# Patient Record
Sex: Female | Born: 1950 | Hispanic: No | Smoking: Never smoker
Health system: Southern US, Community
[De-identification: ages and names within clinical notes are randomized; demographics above are authoritative.]

## PROBLEM LIST (undated history)

## (undated) DIAGNOSIS — M1990 Unspecified osteoarthritis, unspecified site: Secondary | ICD-10-CM

## (undated) DIAGNOSIS — T148XXA Other injury of unspecified body region, initial encounter: Secondary | ICD-10-CM

## (undated) DIAGNOSIS — E538 Deficiency of other specified B group vitamins: Secondary | ICD-10-CM

## (undated) DIAGNOSIS — K219 Gastro-esophageal reflux disease without esophagitis: Secondary | ICD-10-CM

## (undated) DIAGNOSIS — I10 Essential (primary) hypertension: Secondary | ICD-10-CM

## (undated) DIAGNOSIS — E039 Hypothyroidism, unspecified: Secondary | ICD-10-CM

## (undated) DIAGNOSIS — M549 Dorsalgia, unspecified: Secondary | ICD-10-CM

## (undated) DIAGNOSIS — L811 Chloasma: Secondary | ICD-10-CM

## (undated) DIAGNOSIS — F329 Major depressive disorder, single episode, unspecified: Secondary | ICD-10-CM

## (undated) DIAGNOSIS — G8929 Other chronic pain: Secondary | ICD-10-CM

## (undated) DIAGNOSIS — E785 Hyperlipidemia, unspecified: Secondary | ICD-10-CM

## (undated) DIAGNOSIS — S43006A Unspecified dislocation of unspecified shoulder joint, initial encounter: Secondary | ICD-10-CM

## (undated) DIAGNOSIS — F32A Depression, unspecified: Secondary | ICD-10-CM

## (undated) DIAGNOSIS — G629 Polyneuropathy, unspecified: Secondary | ICD-10-CM

## (undated) DIAGNOSIS — M797 Fibromyalgia: Secondary | ICD-10-CM

## (undated) DIAGNOSIS — Z8719 Personal history of other diseases of the digestive system: Secondary | ICD-10-CM

## (undated) DIAGNOSIS — G479 Sleep disorder, unspecified: Secondary | ICD-10-CM

## (undated) DIAGNOSIS — Z3400 Encounter for supervision of normal first pregnancy, unspecified trimester: Secondary | ICD-10-CM

## (undated) HISTORY — DX: Deficiency of other specified B group vitamins: E53.8

## (undated) HISTORY — DX: Encounter for supervision of normal first pregnancy, unspecified trimester: Z34.00

## (undated) HISTORY — DX: Depression, unspecified: F32.A

## (undated) HISTORY — DX: Hypothyroidism, unspecified: E03.9

## (undated) HISTORY — DX: Chloasma: L81.1

## (undated) HISTORY — DX: Unspecified dislocation of unspecified shoulder joint, initial encounter: S43.006A

## (undated) HISTORY — DX: Major depressive disorder, single episode, unspecified: F32.9

## (undated) HISTORY — PX: OTHER SURGICAL HISTORY: SHX169

## (undated) HISTORY — DX: Gastro-esophageal reflux disease without esophagitis: K21.9

## (undated) HISTORY — DX: Hyperlipidemia, unspecified: E78.5

## (undated) HISTORY — DX: Unspecified osteoarthritis, unspecified site: M19.90

## (undated) HISTORY — DX: Other injury of unspecified body region, initial encounter: T14.8XXA

## (undated) HISTORY — DX: Sleep disorder, unspecified: G47.9

## (undated) HISTORY — DX: Personal history of other diseases of the digestive system: Z87.19

---

## 2001-05-26 ENCOUNTER — Other Ambulatory Visit: Admission: RE | Admit: 2001-05-26 | Discharge: 2001-05-26 | Payer: Self-pay | Admitting: Family Medicine

## 2002-04-18 ENCOUNTER — Other Ambulatory Visit: Admission: RE | Admit: 2002-04-18 | Discharge: 2002-04-18 | Payer: Self-pay | Admitting: Internal Medicine

## 2003-08-14 ENCOUNTER — Other Ambulatory Visit: Admission: RE | Admit: 2003-08-14 | Discharge: 2003-08-14 | Payer: Self-pay | Admitting: Internal Medicine

## 2003-12-16 LAB — HM COLONOSCOPY: HM Colonoscopy: NORMAL

## 2004-08-20 ENCOUNTER — Encounter: Admission: RE | Admit: 2004-08-20 | Discharge: 2004-08-20 | Payer: Self-pay | Admitting: Internal Medicine

## 2004-09-02 ENCOUNTER — Other Ambulatory Visit: Admission: RE | Admit: 2004-09-02 | Discharge: 2004-09-02 | Payer: Self-pay | Admitting: Internal Medicine

## 2005-07-21 ENCOUNTER — Encounter: Admission: RE | Admit: 2005-07-21 | Discharge: 2005-07-21 | Payer: Self-pay | Admitting: Internal Medicine

## 2005-12-05 ENCOUNTER — Other Ambulatory Visit: Admission: RE | Admit: 2005-12-05 | Discharge: 2005-12-05 | Payer: Self-pay | Admitting: Internal Medicine

## 2007-12-07 ENCOUNTER — Encounter (INDEPENDENT_AMBULATORY_CARE_PROVIDER_SITE_OTHER): Payer: Self-pay | Admitting: *Deleted

## 2007-12-07 ENCOUNTER — Encounter: Payer: Self-pay | Admitting: Internal Medicine

## 2008-01-13 ENCOUNTER — Emergency Department (HOSPITAL_COMMUNITY): Admission: EM | Admit: 2008-01-13 | Discharge: 2008-01-13 | Payer: Self-pay | Admitting: Emergency Medicine

## 2008-01-19 ENCOUNTER — Emergency Department (HOSPITAL_COMMUNITY): Admission: EM | Admit: 2008-01-19 | Discharge: 2008-01-19 | Payer: Self-pay | Admitting: Emergency Medicine

## 2008-12-26 ENCOUNTER — Encounter: Payer: Self-pay | Admitting: Internal Medicine

## 2009-06-12 ENCOUNTER — Encounter: Payer: Self-pay | Admitting: Internal Medicine

## 2009-07-23 ENCOUNTER — Ambulatory Visit: Payer: Self-pay | Admitting: Internal Medicine

## 2009-11-19 ENCOUNTER — Ambulatory Visit: Payer: Self-pay | Admitting: Internal Medicine

## 2009-11-19 DIAGNOSIS — K219 Gastro-esophageal reflux disease without esophagitis: Secondary | ICD-10-CM

## 2009-11-19 DIAGNOSIS — M810 Age-related osteoporosis without current pathological fracture: Secondary | ICD-10-CM | POA: Insufficient documentation

## 2009-11-19 DIAGNOSIS — F329 Major depressive disorder, single episode, unspecified: Secondary | ICD-10-CM | POA: Insufficient documentation

## 2009-11-19 DIAGNOSIS — E785 Hyperlipidemia, unspecified: Secondary | ICD-10-CM

## 2009-11-19 DIAGNOSIS — D518 Other vitamin B12 deficiency anemias: Secondary | ICD-10-CM

## 2009-12-27 ENCOUNTER — Telehealth: Payer: Self-pay | Admitting: *Deleted

## 2010-02-10 ENCOUNTER — Encounter: Admission: RE | Admit: 2010-02-10 | Discharge: 2010-02-10 | Payer: Self-pay | Admitting: Orthopedic Surgery

## 2010-03-05 ENCOUNTER — Telehealth: Payer: Self-pay | Admitting: *Deleted

## 2010-03-15 ENCOUNTER — Encounter: Admission: RE | Admit: 2010-03-15 | Discharge: 2010-03-15 | Payer: Self-pay | Admitting: Orthopedic Surgery

## 2010-03-17 LAB — HM PAP SMEAR

## 2010-03-20 ENCOUNTER — Ambulatory Visit: Payer: Self-pay | Admitting: Internal Medicine

## 2010-03-20 LAB — CONVERTED CEMR LAB
AST: 29 units/L (ref 0–37)
Albumin: 4.1 g/dL (ref 3.5–5.2)
Alkaline Phosphatase: 53 units/L (ref 39–117)
Basophils Relative: 0.8 % (ref 0.0–3.0)
Bilirubin Urine: NEGATIVE
CO2: 28 meq/L (ref 19–32)
Calcium: 8.8 mg/dL (ref 8.4–10.5)
Chloride: 106 meq/L (ref 96–112)
Cholesterol: 204 mg/dL — ABNORMAL HIGH (ref 0–200)
Creatinine, Ser: 0.8 mg/dL (ref 0.4–1.2)
Eosinophils Relative: 1 % (ref 0.0–5.0)
Free T4: 0.7 ng/dL (ref 0.6–1.6)
Glucose, Bld: 88 mg/dL (ref 70–99)
Glucose, Urine, Semiquant: NEGATIVE
HCT: 40.6 % (ref 36.0–46.0)
Hemoglobin: 13.8 g/dL (ref 12.0–15.0)
Lymphs Abs: 1.6 10*3/uL (ref 0.7–4.0)
MCV: 95.1 fL (ref 78.0–100.0)
Monocytes Absolute: 0.4 10*3/uL (ref 0.1–1.0)
Monocytes Relative: 7.6 % (ref 3.0–12.0)
Neutro Abs: 3.4 10*3/uL (ref 1.4–7.7)
Platelets: 303 10*3/uL (ref 150.0–400.0)
RBC: 4.26 M/uL (ref 3.87–5.11)
Total Bilirubin: 0.7 mg/dL (ref 0.3–1.2)
Total CHOL/HDL Ratio: 2
Urobilinogen, UA: 0.2
VLDL: 25.2 mg/dL (ref 0.0–40.0)
Vit D, 25-Hydroxy: 51 ng/mL (ref 30–89)
Vitamin B-12: 431 pg/mL (ref 211–911)
WBC: 5.5 10*3/uL (ref 4.5–10.5)
pH: 5.5

## 2010-03-27 ENCOUNTER — Ambulatory Visit: Payer: Self-pay | Admitting: Internal Medicine

## 2010-03-27 ENCOUNTER — Other Ambulatory Visit: Admission: RE | Admit: 2010-03-27 | Discharge: 2010-03-27 | Payer: Self-pay | Admitting: Internal Medicine

## 2010-03-27 DIAGNOSIS — G479 Sleep disorder, unspecified: Secondary | ICD-10-CM | POA: Insufficient documentation

## 2010-03-29 ENCOUNTER — Ambulatory Visit: Payer: Self-pay | Admitting: Internal Medicine

## 2010-04-01 ENCOUNTER — Telehealth: Payer: Self-pay | Admitting: *Deleted

## 2010-04-08 ENCOUNTER — Encounter: Payer: Self-pay | Admitting: Internal Medicine

## 2010-04-08 ENCOUNTER — Ambulatory Visit: Payer: Self-pay | Admitting: Internal Medicine

## 2010-04-09 ENCOUNTER — Telehealth: Payer: Self-pay | Admitting: *Deleted

## 2010-04-16 ENCOUNTER — Encounter: Payer: Self-pay | Admitting: Internal Medicine

## 2010-04-17 ENCOUNTER — Telehealth: Payer: Self-pay | Admitting: Internal Medicine

## 2010-04-18 ENCOUNTER — Encounter: Payer: Self-pay | Admitting: Internal Medicine

## 2010-04-26 ENCOUNTER — Encounter: Payer: Self-pay | Admitting: Internal Medicine

## 2010-05-08 ENCOUNTER — Ambulatory Visit: Payer: Self-pay | Admitting: Internal Medicine

## 2010-05-08 DIAGNOSIS — M810 Age-related osteoporosis without current pathological fracture: Secondary | ICD-10-CM

## 2010-05-15 ENCOUNTER — Telehealth: Payer: Self-pay | Admitting: Internal Medicine

## 2010-05-21 ENCOUNTER — Emergency Department (HOSPITAL_COMMUNITY): Admission: EM | Admit: 2010-05-21 | Discharge: 2010-05-21 | Payer: Self-pay | Admitting: Emergency Medicine

## 2010-07-03 ENCOUNTER — Telehealth: Payer: Self-pay | Admitting: Internal Medicine

## 2010-07-23 ENCOUNTER — Ambulatory Visit: Payer: Self-pay | Admitting: Family Medicine

## 2010-07-23 DIAGNOSIS — R634 Abnormal weight loss: Secondary | ICD-10-CM | POA: Insufficient documentation

## 2010-07-23 DIAGNOSIS — R030 Elevated blood-pressure reading, without diagnosis of hypertension: Secondary | ICD-10-CM

## 2010-07-23 DIAGNOSIS — R238 Other skin changes: Secondary | ICD-10-CM

## 2010-07-24 ENCOUNTER — Telehealth: Payer: Self-pay | Admitting: Family Medicine

## 2010-07-24 LAB — CONVERTED CEMR LAB
ALT: 21 units/L (ref 0–35)
AST: 32 units/L (ref 0–37)
Albumin: 4.5 g/dL (ref 3.5–5.2)
Alkaline Phosphatase: 60 units/L (ref 39–117)
Basophils Absolute: 0 10*3/uL (ref 0.0–0.1)
Bilirubin, Direct: 0.1 mg/dL (ref 0.0–0.3)
CO2: 24 meq/L (ref 19–32)
Chloride: 101 meq/L (ref 96–112)
Eosinophils Relative: 0.3 % (ref 0.0–5.0)
Glucose, Bld: 79 mg/dL (ref 70–99)
Hemoglobin: 15.1 g/dL — ABNORMAL HIGH (ref 12.0–15.0)
Lymphocytes Relative: 35.1 % (ref 12.0–46.0)
Monocytes Relative: 6.2 % (ref 3.0–12.0)
Platelets: 236 10*3/uL (ref 150.0–400.0)
RDW: 16.3 % — ABNORMAL HIGH (ref 11.5–14.6)
Sodium: 141 meq/L (ref 135–145)
Total Protein: 7.3 g/dL (ref 6.0–8.3)
WBC: 5.3 10*3/uL (ref 4.5–10.5)

## 2010-09-09 ENCOUNTER — Telehealth: Payer: Self-pay | Admitting: *Deleted

## 2010-09-18 ENCOUNTER — Encounter: Payer: Self-pay | Admitting: Internal Medicine

## 2010-09-19 ENCOUNTER — Inpatient Hospital Stay (HOSPITAL_COMMUNITY): Admission: EM | Admit: 2010-09-19 | Discharge: 2010-09-20 | Payer: Self-pay | Admitting: Emergency Medicine

## 2010-09-20 ENCOUNTER — Ambulatory Visit: Payer: Self-pay | Admitting: Psychiatry

## 2010-09-20 ENCOUNTER — Encounter: Payer: Self-pay | Admitting: Internal Medicine

## 2010-09-20 ENCOUNTER — Inpatient Hospital Stay (HOSPITAL_COMMUNITY): Admission: EM | Admit: 2010-09-20 | Discharge: 2010-09-22 | Payer: Self-pay | Admitting: Psychiatry

## 2010-10-09 ENCOUNTER — Ambulatory Visit: Payer: Self-pay | Admitting: Internal Medicine

## 2010-10-09 DIAGNOSIS — L659 Nonscarring hair loss, unspecified: Secondary | ICD-10-CM | POA: Insufficient documentation

## 2010-10-09 LAB — CONVERTED CEMR LAB
Cholesterol: 279 mg/dL — ABNORMAL HIGH (ref 0–200)
HDL: 117.9 mg/dL (ref >39.00)
Total CHOL/HDL Ratio: 2
Triglycerides: 178 mg/dL — ABNORMAL HIGH (ref 0.0–149.0)
VLDL: 35.6 mg/dL (ref 0.0–40.0)

## 2010-10-10 ENCOUNTER — Encounter: Payer: Self-pay | Admitting: Internal Medicine

## 2010-10-11 ENCOUNTER — Telehealth: Payer: Self-pay | Admitting: Internal Medicine

## 2010-10-16 ENCOUNTER — Encounter: Payer: Self-pay | Admitting: Internal Medicine

## 2010-11-02 ENCOUNTER — Ambulatory Visit: Payer: Self-pay | Admitting: Psychiatry

## 2010-12-12 ENCOUNTER — Encounter: Payer: Self-pay | Admitting: *Deleted

## 2011-01-02 ENCOUNTER — Telehealth: Payer: Self-pay | Admitting: Internal Medicine

## 2011-01-03 ENCOUNTER — Ambulatory Visit: Admit: 2011-01-03 | Payer: Self-pay | Admitting: Internal Medicine

## 2011-01-03 ENCOUNTER — Encounter: Payer: Self-pay | Admitting: Internal Medicine

## 2011-01-04 ENCOUNTER — Encounter: Payer: Self-pay | Admitting: Internal Medicine

## 2011-01-05 ENCOUNTER — Encounter: Payer: Self-pay | Admitting: Orthopedic Surgery

## 2011-01-05 ENCOUNTER — Encounter: Payer: Self-pay | Admitting: Sports Medicine

## 2011-01-12 LAB — CONVERTED CEMR LAB: Pap Smear: NEGATIVE

## 2011-01-14 ENCOUNTER — Encounter: Payer: Self-pay | Admitting: Internal Medicine

## 2011-01-14 NOTE — Progress Notes (Signed)
Summary: new rx to [facility]  Phone Note Call from Patient Call back at Home Phone [phone]   Caller: Patient Call For: Madelin Headings MD Summary of Call: pt switch pharmacy please call [facility] [address] [phone] for new rx hydrocodone 10-660 mg Initial call taken by: Heron Sabins,  December 27, 2009 8:48 AM  Follow-up for Phone Call        ok to refill the  vicodin hp  disp 90 no refills  to [facility] See med list. Follow-up by: Madelin Headings MD,  December 27, 2009 10:07 AM  Additional Follow-up for Phone Call Additional follow up Details #1::        Rx called in. Additional Follow-up by: Romualdo Bolk, CMA (AAMA),  December 27, 2009 12:00 PM    Prescriptions: VICODIN HP 10-660 MG TABS (HYDROCODONE-ACETAMINOPHEN) 1 by mouth three times a day for back and knee pain  #90 x 0   Entered by:   Romualdo Bolk, CMA (AAMA)   Authorized by:   Madelin Headings MD   Signed by:   Romualdo Bolk, CMA (AAMA) on 12/27/2009   Method used:   Telephoned to ...       [facility]  [address]  [phone]* (retail)       [address]       [place], [place]  [address]       Ph: [phone] or [phone]       Fax: [phone]   RxID:   [id number]

## 2011-01-14 NOTE — Progress Notes (Signed)
Summary: refill on pain meds  Phone Note Call from Patient Call back at Home Phone [phone]   Caller: Patient Summary of Call: Pt needs a refill on her pain meds. Initial call taken by: Romualdo Bolk, CMA Duncan Dull),  October 11, 2010 8:37 AM  Follow-up for Phone Call        i wasnt aware she was still taking a narcotic pain med.  advise  when she last took these  and why.   Also  get opinion from psych  about advisabiliy of these meds.   also labs show elevated lipids but mostly good cholesterol. thyroid shows poss mild hyperthyroid issues  . rec we get endocrine to see her .    ? is this ok? Follow-up by: Madelin Headings MD,  October 11, 2010 10:25 AM  Additional Follow-up for Phone Call Additional follow up Details #1::        Spoke with pt and the last time she took the pain meds was 04/10/10 for back pain. Pt states that she has 4 tabs left.  Pt is aware of the results and wants to go ahead with referral.  Spoke to Dr. Carie Caddy office and Dr. Evelene Croon states that it is fine to give her this rx. Additional Follow-up by: Romualdo Bolk, CMA Duncan Dull),  October 11, 2010 11:23 AM    Additional Follow-up for Phone Call Additional follow up Details #2::    Per Dr. Fabian Sharp- Okay to refill x 1. see refill request. Follow-up by: Romualdo Bolk, CMA (AAMA),  October 11, 2010 11:45 AM

## 2011-01-14 NOTE — Assessment & Plan Note (Signed)
Summary: consult re thyroid and cholesterol issues/hair loss/cjr   Vital Signs:  Patient profile:   60 year old female Menstrual status:  postmenopausal Height:      60.5 inches Weight:      109 pounds BMI:     21.01 Pulse rate:   66 / minute BP sitting:   100 / 70  (left arm) Cuff size:   regular  Vitals Entered By: Romualdo Bolk, CMA (AAMA) (October 09, 2010 12:23 PM) CC: Pt wants thyroid checked because she is loosing hair. Pt states that ranitidine doesn't work for her.   History of Present Illness: Adriana Figueroa comes in today  because of concern about hair shedding and thinning   most recently   and would like lipids checked. Since last visit she has been [facility]  10/7 2011 with suicidal  thoughts  after her husband of 35 years  left.     She is now under rx with dr Evelene Croon.  and doing ok but very stressed. Anxious also about her hair falling out.  No injury or change in meds except as above.   GI: ranitidine  2 once daily still results in break through  burrning but trying to limit the PPi for bone health.  No fractures  .  A   Preventive Screening-Counseling & Management  Alcohol-Tobacco     Alcohol drinks/day: 0     Smoking Status: never  Caffeine-Diet-Exercise     Caffeine use/day: 0     Does Patient Exercise: yes     Type of exercise: treadmill     Exercise (avg: min/session): 5  Current Medications (verified): 1)  Pravastatin Sodium 40 Mg Tabs (Pravastatin Sodium) .Marland Kitchen.. 1 By Mouth Once Daily 2)  Cyanocobalamin 1000 Mcg/ml Soln (Cyanocobalamin) .... Monthly 3)  Vicodin Hp 10-660 Mg Tabs (Hydrocodone-Acetaminophen) .Marland Kitchen.. 1 By Mouth Three Times A Day For Back and Knee Pain 4)  Reclast 5 Mg/124ml Soln (Zoledronic Acid) 5)  Ranitidine Hcl 150 Mg Tabs (Ranitidine Hcl) .Marland Kitchen.. 1 By Mouth Two Times A Day 6)  Trazodone Hcl 50 Mg Tabs (Trazodone Hcl) .... At Night Per Dr Helane Rima. 7)  Promethazine Hcl 25 Mg Tabs (Promethazine Hcl) .Marland Kitchen.. 1 Poq4-6 Hours  As Needed Nausea 8)  Bd Luer-Lok Syringe 25g X 1" 3 Ml Misc (Syringe/needle (Disp)) .... Use As Directed 9)  Wellbutrin Xl 300 Mg Xr24h-Tab (Bupropion Hcl) .Marland Kitchen.. 1 By Mouth Once Daily 10)  Remeron 15 Mg Tabs (Mirtazapine) .Marland Kitchen.. 1 By Mouth Once Daily  Allergies (verified): No Known Drug Allergies  Past History:  Past medical, surgical, family and social histories (including risk factors) reviewed, and no changes noted (except as noted below).  Past Medical History: Depression  decrease sleep. GERD on nexium since 1989  reported having EGD  report not availabe. 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 Bunionectomy on Left in 2009 Dislocated Rt shoulder 2008 Back surgeon.  Ne   Past History:  Care Management: Dermatology: [place] [facility]ics: [facility] Psych: Evelene Croon NS:  Phoebe Perch  Family History: Reviewed history from 11/19/2009 and no changes required. Father: Deceased from Brain aneurysm Mother: HBP, osteoposis, kidney failure Siblings:  Healthy  Social History: Reviewed history from 03/27/2010 and no changes required. Occupation: Homemaker  BS  husband has MS    divorcing Married Never Smoked Alcohol use-no Drug use-no Regular exercise-yes No pets  [place]  Is a vegetarian  eats dairy and takes vitamins. From [place]   originally.   Review of Systems  The patient denies fever, chest pain, syncope, dyspnea on exertion, prolonged cough, abdominal pain, transient blindness, difficulty walking, abnormal bleeding, enlarged lymph nodes, and angioedema.         gets back pain and reflux signs   Physical Exam  General:  tired but healthy appearing in  nad  not agitated  concerned about her hair Head:  normocephalic and atraumatic.   Eyes:  vision grossly intact.   Neck:  No deformities, masses, or tenderness noted. Lungs:  normal respiratory effort, no intercostal retractions, and no accessory muscle use.   Heart:  normal rate, regular rhythm, and no murmur.   Msk:  no redness over joints.   Pulses:  nl cap refill  Extremities:  no clubbing cyanosis or edema  Neurologic:  alert & oriented X3 and gait normal.  non focal  Skin:  turgor normal, color normal, no ecchymoses, no petechiae, and no purpura.   scalp no scarring some thinning top of head.  Cervical Nodes:  No lymphadenopathy noted Psych:  midly  Oriented X3, good eye contact, and not agitated.   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 Laboratory test [phone]) Venipuncture [phone]) Specimen Handling (53664) TLB-TSH (Thyroid Stimulating Hormone) (84443-TSH) TLB-T4 (Thyrox), Free [phone]) TLB-T3, Free (Triiodothyronine) (84481-T3FREE) TLB-B12 + Folate Pnl (82746_82607-B12/FOL)  Problem # 2:  OSTEOPOROSIS (ICD-733.00) on going no fx now Her updated medication list for this problem includes:    Reclast 5 Mg/132ml Soln (Zoledronic acid)  Problem # 3:  DEPRESSION (ICD-311)  agree going back to counseling Jerral Bonito  in addition to her psych meds  with her Geoffry Paradise break up and ,any culturalfactores that complicate the issues  The following medications were removed from the medication list:    Wellbutrin Xl 300 Mg Xr24h-tab (Bupropion hcl) .Marland Kitchen... 1 by mouth qd Her updated medication list for this problem includes:    Trazodone Hcl 50 Mg Tabs (Trazodone hcl) .Marland Kitchen... At night per dr Helane Rima.    Wellbutrin Xl 300 Mg Xr24h-tab (Bupropion hcl) .Marland Kitchen... 1 by mouth once daily    Remeron 15 Mg Tabs (Mirtazapine) .Marland Kitchen... 1 by mouth once  daily  Problem # 4:  GERD (ICD-530.81) disc  incr ranitidine and dec nex The following medications were removed from the medication list:    Nexium 40 Mg Cpdr (Esomeprazole magnesium) .Marland Kitchen... 1 by mouth once daily Her updated medication list for this problem includes:    Ranitidine Hcl 150 Mg Tabs (Ranitidine hcl) .Marland Kitchen... 2 by mouth two times a day  Problem # 5:  ? b12 deficiency  takes b12 shots but  unsure of documentation of  diagnosis  Problem # 6:  HYPERLIPIDEMIA (ICD-272.4) recheck Her updated medication list for this problem includes:    Pravastatin Sodium 40 Mg Tabs (Pravastatin sodium) .Marland Kitchen... 1 by mouth once daily  Orders: Venipuncture (56387) Specimen Handling (56433) TLB-Lipid Panel (80061-LIPID)  Labs Reviewed: SGOT: 32 (07/23/2010)   SGPT: 21 (07/23/2010)   HDL:88.40 (03/20/2010)  Chol:204 (03/20/2010)  Trig:126.0 (  03/20/2010)  Complete Medication List: 1)  Pravastatin Sodium 40 Mg Tabs (Pravastatin sodium) .Marland Kitchen.. 1 by mouth once daily 2)  Cyanocobalamin 1000 Mcg/ml Soln (Cyanocobalamin) .... Monthly 3)  Vicodin Hp 10-660 Mg Tabs (Hydrocodone-acetaminophen) .Marland Kitchen.. 1 by mouth three times a day for back and knee pain 4)  Reclast 5 Mg/142ml Soln (Zoledronic acid) 5)  Ranitidine Hcl 150 Mg Tabs (Ranitidine hcl) .... 2 by mouth two times a day 6)  Trazodone Hcl 50 Mg Tabs (Trazodone hcl) .... At night per dr Helane Rima. 7)  Promethazine Hcl 25 Mg Tabs (Promethazine hcl) .Marland Kitchen.. 1 poq4-6 hours as needed nausea 8)  Bd Luer-lok Syringe 25g X 1" 3 Ml Misc (Syringe/needle (disp)) .... Use as directed 9)  Wellbutrin Xl 300 Mg Xr24h-tab (Bupropion hcl) .Marland Kitchen.. 1 by mouth once daily 10)  Remeron 15 Mg Tabs (Mirtazapine) .Marland Kitchen.. 1 by mouth once daily  Patient Instructions: 1)  You will be informed of lab results when available.  2)  this may be from the extreme stress you have been under. 3)  try the ranitidine 2 two times a day  for you stomach. to see if that works better.    Contraindications/Deferment of Procedures/Staging:    Test/Procedure: FLU VAX    Reason for deferment: patient declined  Prescriptions: RANITIDINE HCL 150 MG TABS (RANITIDINE HCL) 2 by mouth two times a day  #120 x 4   Entered and Authorized by:   Madelin Headings MD   Signed by:   Madelin Headings MD on 10/09/2010   Method used:   Electronically to        [facility]  [phone]* (retail)       [address]       [place], [place]  [address]       Ph: [phone] or [phone]       Fax: [phone]   RxID:   [id number]    Orders Added: 1)  T- * Misc. Laboratory test [99999] 2)  Venipuncture [36415] 3)  Specimen Handling [99000] 4)  TLB-TSH (Thyroid Stimulating Hormone) [84443-TSH] 5)  TLB-T4 (Thyrox), Free [36644-IH4V] 6)  TLB-T3, Free (Triiodothyronine) [42595-G3OVFI] 7)  TLB-B12 + Folate Pnl [82746_82607-B12/FOL] 8)  TLB-Lipid Panel [80061-LIPID] 9)  Est. Patient Level IV [43329]

## 2011-01-14 NOTE — Letter (Signed)
Summary: Records from [facility] - 2010  Records from [facility] - 2010   Imported By: Maryln Gottron 04/08/2010 11:22:56  _____________________________________________________________________  External Attachment:    Type:   Image     Comment:   External Document

## 2011-01-14 NOTE — Progress Notes (Signed)
Summary: diarrhea x 2 days  Phone Note Call from Patient Call back at [facility] Phone [phone]   Caller: Patient Summary of Call: Pt called saying that she is weak and took an half bottle of pepto. Initial call taken by: Romualdo Bolk, CMA (AAMA),  July 03, 2010 11:22 AM  Follow-up for Phone Call        Spoke to pt and she had a little bit of yogart this am and had diarrhea. No fever or abd. pain. Pt has been having diarrhea x 2 days. Pt has taken pepto every 1/2 hour since on 6pm.  Follow-up by: Romualdo Bolk, CMA Duncan Dull),  July 03, 2010 12:59 PM  Additional Follow-up for Phone Call Additional follow up Details #1::        Pt called back saying that everything has subsided and she is fine now. So she doesn't need a call back. Additional Follow-up by: Romualdo Bolk, CMA (AAMA),  July 03, 2010 2:15 PM

## 2011-01-14 NOTE — Progress Notes (Signed)
Summary: Question about Providers  Phone Note Call from Patient Call back at Home Phone [phone]   Caller: Patient Summary of Call: Dr. Fabian Sharp recommended a few psychiatrist for me to see.  However none of these participate with my insurance. I would like to know if Dr. Fabian Sharp has heard of any of the providers my insurance recommended.  Please call back. Initial call taken by: Trixie Dredge,  April 09, 2010 10:19 AM  Follow-up for Phone Call        LMTOCB and leave the name of the providers that are covered under her ins. Follow-up by: Romualdo Bolk, CMA Duncan Dull),  April 09, 2010 10:49 AM  Additional Follow-up for Phone Call Additional follow up Details #1::        Pt called back saying the names were Jamas Lav, Amy Sharrits, Deneen Harts, Dr. Evelene Croon I told pt that Dr. Fabian Sharp does alot of referrals to Dr. Evelene Croon. Pt is aware of this and will call her to schedule an appt. Additional Follow-up by: Romualdo Bolk, CMA (AAMA),  April 09, 2010 11:50 AM

## 2011-01-14 NOTE — Progress Notes (Signed)
Summary: psych referral  Phone Note Call from Patient Call back at Home Phone [phone]   Caller: Patient Summary of Call: Pt called back. Dr. Raquel James is not taking any new pt's and Dr. Evelene Croon can't see her until the  June 10th and she is on the cancelation list. What do you think of Dr. Katharina Caper, Ezzard Flax? They are the only other one's that are covered by her Ins. Initial call taken by: Romualdo Bolk, CMA Duncan Dull),  April 09, 2010 1:57 PM  Follow-up for Phone Call        no opinion I  dont have experience with them . Make sure you call your insurance co also to see if others are now on you plan .   Follow-up by: Madelin Headings MD,  April 09, 2010 5:09 PM  Additional Follow-up for Phone Call Additional follow up Details #1::        Pt has decided to keep the appt with Dr. Evelene Croon for June 10th. Additional Follow-up by: Romualdo Bolk, CMA (AAMA),  April 10, 2010 1:03 PM

## 2011-01-14 NOTE — Progress Notes (Signed)
Summary: Pts Mammogram and Dexa req Prior Authorization via [employer]  Phone Note Call from Patient Call back at [facility] Phone [phone]   Caller: Patient Summary of Call: Pt called and said that [place] insurance is requiring that she gets authorization from Dr. Fabian Sharp in order to get Mammogram and Dexa Scans.  The code for Dexa is [phone].  Initial call taken by: Lucy Antigua,  April 01, 2010 8:51 AM  Follow-up for Phone Call        Order sent to [facility].  Follow-up by: Romualdo Bolk, CMA Duncan Dull),  April 01, 2010 8:55 AM     Appended Document: Orders Update    Clinical Lists Changes  Orders: Added new Referral order of Misc. Referral (Misc. Ref) - Signed

## 2011-01-14 NOTE — Progress Notes (Signed)
Summary: anal irriation  Phone Note Call from Patient Call back at Home Phone [phone]   Caller: Patient Summary of Call: Pt is having severe irriation in anal. Pt is not having any itching just burning. No blood or swelling. This has been going on x 3 days. Pt has been using the wet wipes after going the bathroom. Pt is going to try this first and also put some hydrocortisone cream as well.  Initial call taken by: Romualdo Bolk, CMA Duncan Dull),  September 09, 2010 1:37 PM  Follow-up for Phone Call        agree Follow-up by: Madelin Headings MD,  September 10, 2010 1:16 PM

## 2011-01-14 NOTE — Letter (Signed)
[facility]: [facility]   Imported By: Maryln Gottron 10/03/2010 12:46:46  _____________________________________________________________________  External Attachment:    Type:   Image     Comment:   External Document

## 2011-01-14 NOTE — Assessment & Plan Note (Signed)
Summary: fup//ccm   Vital Signs:  Patient profile:   60 year old female Menstrual status:  postmenopausal Height:      60.5 inches Weight:      110 pounds Temp:     98.3 degrees F oral Pulse rate:   93 / minute BP sitting:   120 / 90  (right arm)  Vitals Entered By: Kathrynn Speed CMA (May 08, 2010 11:20 AM) CC: Fu on bone denisty & mamo/ Take off Nexium /Rx   History of Present Illness: Adriana Figueroa  comes in today  for follow up of multiple medical problems  Since last visit ahs seen back specialist and ns and non surgical problem .   is beeing controlled with three times a day narcotic pain med Psych : seens at Dr Nolen Mu  and back on meds  wellbutrin and trazadone for sleep with some help.  DEXA: had recent dxa showing osteopenia      no recent fx  GI; has tried  to go off ppi    and some rebound     worried about bones .  Mammo: had  diagnostic and was ok but rec 6 months follow up and is concerned if this is too many mammos to do.     Preventive Screening-Counseling & Management  Alcohol-Tobacco     Alcohol drinks/day: 0     Smoking Status: never  Caffeine-Diet-Exercise     Caffeine use/day: 0     Does Patient Exercise: yes     Type of exercise: treadmill     Exercise (avg: min/session): 5  Current Medications (verified): 1)  Wellbutrin Xl 300 Mg Xr24h-Tab (Bupropion Hcl) .Marland Kitchen.. 1 By Mouth Qd 2)  Nexium 40 Mg Cpdr (Esomeprazole Magnesium) .Marland Kitchen.. 1 By Mouth Once Daily 3)  Pravastatin Sodium 40 Mg Tabs (Pravastatin Sodium) .Marland Kitchen.. 1 By Mouth Once Daily 4)  Cyanocobalamin 1000 Mcg/ml Soln (Cyanocobalamin) .... Monthly 5)  Vicodin Hp 10-660 Mg Tabs (Hydrocodone-Acetaminophen) .Marland Kitchen.. 1 By Mouth Three Times A Day For Back and Knee Pain 6)  Reclast 5 Mg/172ml Soln (Zoledronic Acid) 7)  Ranitidine Hcl 150 Mg Tabs (Ranitidine Hcl) .Marland Kitchen.. 1 By Mouth Two Times A Day  Allergies (verified): No Known Drug Allergies  Past History:  Past Medical History: Depression  decrease  sleep. GERD on nexium since 1989  reported having EGD  report not availabe. 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         no cv pulmonary signs   Physical Exam  General:  alert, well-developed, and well-nourished.   in nad  nl gait today and in napain Head:  normocephalic and atraumatic.   Neck:  No deformities, masses, or tenderness noted. Lungs:  normal respiratory effort and no intercostal retractions.   Heart:  normal rate and regular rhythm.   Pulses:  nl cap refill  Neurologic:  nono focal  Cervical Nodes:  No lymphadenopathy noted Psych:  Oriented X3, normally interactive, and good eye contact.  more relaxed and less nxious than bfore    Impression & Recommendations:  Problem # 1:  BACK PAIN W SCOLIOSIS (ICD-724.5) has been  taking regular  narcotic   pain med for control and is better with this today Her updated medication list for this problem includes:    Vicodin Hp 10-660 Mg Tabs (Hydrocodone-acetaminophen) .Marland Kitchen... 1 by mouth three times a day for back and knee pain  Problem # 2:  POSTMENOPAUSAL OSTEOPOROSIS (ICD-733.01)  last dexa was in osteopenia range but had a dexa in the past in the -3 range   before undergoing rx .    did well wit th last infusion  and ok to contiue yearly  for now.      hx of fractures  also on long term ppi in the past  Her updated medication list for this problem includes:    Reclast 5 Mg/186ml Soln (Zoledronic acid)  Orders: Misc. Referral (Misc. Ref)  Problem # 3:  SLEEP DISORDER/DISTURBANCE (ICD-780.50)  Problem # 4:   DEPRESSION (ICD-311) under psych care and doing better  Her updated medication list for this problem includes:    Wellbutrin Xl 300 Mg Xr24h-tab (Bupropion hcl) .Marland Kitchen... 1 by mouth qd    Trazodone Hcl 50 Mg Tabs (Trazodone hcl) .Marland Kitchen... At night per dr Helane Rima.  Problem # 5:  ANEMIA, VITAMIN B12 DEFICIENCY (ICD-281.1) gives her own shots.  Her updated medication list for this problem includes:    Cyanocobalamin 1000 Mcg/ml Soln (Cyanocobalamin) ..... Monthly  Problem # 6:  GERD (ICD-530.81)  Her updated medication list for this problem includes:    Nexium 40 Mg Cpdr (Esomeprazole magnesium) .Marland Kitchen... 1 by mouth once daily    Ranitidine Hcl 150 Mg Tabs (Ranitidine hcl) .Marland Kitchen... 1 by mouth two times a day  Problem # 7:  HYPERLIPIDEMIA (ICD-272.4)  Her updated medication list for this problem includes:    Pravastatin Sodium 40 Mg Tabs (Pravastatin sodium) .Marland Kitchen... 1 by mouth once daily  Complete Medication List: 1)  Wellbutrin Xl 300 Mg Xr24h-tab (Bupropion hcl) .Marland Kitchen.. 1 by mouth qd 2)  Nexium 40 Mg Cpdr (Esomeprazole magnesium) .Marland Kitchen.. 1 by mouth once daily 3)  Pravastatin Sodium 40 Mg Tabs (Pravastatin sodium) .Marland Kitchen.. 1 by mouth once daily 4)  Cyanocobalamin 1000 Mcg/ml Soln (Cyanocobalamin) .... Monthly 5)  Vicodin Hp 10-660 Mg Tabs (Hydrocodone-acetaminophen) .Marland Kitchen.. 1 by mouth three times a day for back and knee pain 6)  Reclast 5 Mg/138ml Soln (Zoledronic acid) 7)  Ranitidine Hcl 150 Mg Tabs (Ranitidine hcl) .Marland Kitchen.. 1 by mouth two times a day 8)  Trazodone Hcl 50 Mg Tabs (Trazodone hcl) .... At night per dr Helane Rima. 9)  Promethazine Hcl 25 Mg Tabs (Promethazine hcl) .Marland Kitchen.. 1 poq4-6 hours as needed nausea 10)  Bd Luer-lok Syringe 25g X 1" 3 Ml Misc (Syringe/needle (disp)) .... Use as directed  Patient Instructions: 1)  can try  2 ranitidine in am   and 1 at night to see if that helps the stomach burning reflux. 2)  Nausea medicine as needed but can cause sedation. 3)  agree with mammogram follow up. 4)  will  call about arranging reclast.  Prescriptions: BD LUER-LOK SYRINGE 25G X 1" 3 ML MISC (SYRINGE/NEEDLE (DISP)) use as directed  #100 x 11   Entered and Authorized by:   Madelin Headings MD   Signed by:   Madelin Headings MD on 05/08/2010   Method used:   Print then Give to Patient   RxID:   [id number] CYANOCOBALAMIN 1000 MCG/ML SOLN (CYANOCOBALAMIN) monthly  #5ml x 11  Entered and Authorized by:   Madelin Headings MD   Signed by:   Madelin Headings MD on 05/08/2010   Method used:   Print then Give to Patient   RxID:   [id number] PROMETHAZINE HCL 25 MG TABS (PROMETHAZINE HCL) 1 poq4-6 hours as needed nausea  #20 x 1   Entered and Authorized by:   Madelin Headings MD   Signed by:   Madelin Headings MD on 05/08/2010   Method used:   Print then Give to Patient   RxID:   [id number] PRAVASTATIN SODIUM 40 MG TABS (PRAVASTATIN SODIUM) 1 by mouth once daily  #30 x 12   Entered and Authorized by:   Madelin Headings MD   Signed by:   Madelin Headings MD on 05/08/2010   Method used:   Print then Give to Patient   RxID:   [id number] RANITIDINE HCL 150 MG TABS (RANITIDINE HCL) 1 by mouth two times a day  #60 x 12   Entered and Authorized by:   Madelin Headings MD   Signed by:   Madelin Headings MD on 05/08/2010   Method used:   Print then Give to Patient   RxID:   [id number]  prolonged viist greater than 50% of visit spent in counseling  25 mintues

## 2011-01-14 NOTE — Progress Notes (Signed)
Summary: Pt cancelled her additional view appt  Phone Note From Other Clinic Call back at [phone]   Caller: [facility] Summary of Call: Pt called them crying that she just wanted to die. Her husband didn't believe her about her having to have addional views of her left breast and her husband cancelled her Ins.  Pt's appt is 5/5 at 3pm. Pt has also told them that she was going to die at age 60. Initial call taken by: Romualdo Bolk, CMA Duncan Dull),  Apr 17, 2010 3:03 PM  Follow-up for Phone Call        patient should see  psychiatrist  and if worse seek emrgent care at [facility] or ED. Follow-up by: Madelin Headings MD,  Apr 17, 2010 10:26 PM  Additional Follow-up for Phone Call Additional follow up Details #1::        LMTOCB Additional Follow-up by: Romualdo Bolk, CMA Duncan Dull),  Apr 18, 2010 12:27 PM    Additional Follow-up for Phone Call Additional follow up Details #2::    Advised pt and she states she does not need to do this. Follow-up by: Lynann Beaver CMA,  Apr 18, 2010 1:21 PM  Additional Follow-up for Phone Call Additional follow up Details #3:: Details for Additional Follow-up Action Taken: Spoke to pt and she states that this is her culture and that is how they talk. Her husband is trying to move and will be canceling her ins. She did go back for the [place] and they want to come back in Nov. She is fine. Additional Follow-up by: Romualdo Bolk, CMA (AAMA),  Apr 19, 2010 11:03 AM

## 2011-01-14 NOTE — Miscellaneous (Signed)
Summary: BONE DENSITY  Clinical Lists Changes  Orders: Added new Test order of T-Bone Densitometry (77080) - Signed Added new Test order of T-Lumbar Vertebral Assessment (77082) - Signed 

## 2011-01-14 NOTE — Progress Notes (Signed)
Summary: Reclast referral?  Phone Note Call from Patient Call back at Home Phone [phone]   Caller: Patient Summary of Call: Pt was calling about referral for reclast. Can we go ahead and get this set up for her? Initial call taken by: Romualdo Bolk, CMA (AAMA),  May 15, 2010 1:15 PM  Follow-up for Phone Call        Per Dr. Fabian Sharp- okay to do. Follow-up by: Romualdo Bolk, CMA Duncan Dull),  May 15, 2010 1:23 PM  Additional Follow-up for Phone Call Additional follow up Details #1::        Order sent to [facility]. Additional Follow-up by: Romualdo Bolk, CMA (AAMA),  May 15, 2010 1:54 PM

## 2011-01-14 NOTE — Progress Notes (Signed)
Summary: REQ FOR REFILL  Phone Note Call from Patient   Caller: Patient [phone] Reason for Call: Refill Medication Summary of Call: Pt called to ck on status of med: VICODIN .Marland Kitchen... Pt adv that the req has been sent by pharmacy but they have not received any response..? Initial call taken by: Debbra Riding,  March 05, 2010 9:34 AM  Follow-up for Phone Call        See refill request. Follow-up by: Romualdo Bolk, CMA Duncan Dull),  March 05, 2010 10:39 AM

## 2011-01-14 NOTE — Progress Notes (Signed)
Summary: Pt req lab results  Phone Note Call from Patient Call back at Home Phone [phone]   Caller: Patient Summary of Call: Pt called re: lab results. Pls call back asap.  Initial call taken by: Lucy Antigua,  July 24, 2010 2:24 PM  Follow-up for Phone Call        Pt aware of results and wants to know how it happened. The spot on her hand is getting wider and wider. Now there is a big knot there and it is painful. Follow-up by: Romualdo Bolk, CMA Duncan Dull),  July 24, 2010 3:24 PM  Additional Follow-up for Phone Call Additional follow up Details #1::        Per Dr. Caryl Never- knot is there because of the clot and it will be painful. This could be because of her diet and she needs take a MVI Additional Follow-up by: Romualdo Bolk, CMA Duncan Dull),  July 24, 2010 3:32 PM    Additional Follow-up for Phone Call Additional follow up Details #2::    Left message for pt to call back. Romualdo Bolk, CMA (AAMA)  July 24, 2010 5:00 PM  Pt aware of this. Follow-up by: Romualdo Bolk, CMA (AAMA),  July 25, 2010 12:07 PM

## 2011-01-14 NOTE — Progress Notes (Signed)
Summary: rx for neurotin and psych referral  Phone Note Call from Patient Call back at Home Phone [phone]   Caller: Patient Summary of Call: Pt is wanting a rx for neurontin and a referral to a psychologist. Initial call taken by: Romualdo Bolk, CMA Duncan Dull),  April 01, 2010 3:16 PM  Follow-up for Phone Call        Terri to find out who covers her Ins. and will get back with [place]. Per Terri- Dr. Len Blalock takes [place].  Spoke to pt and she is aware of her appt with Dr. Fabian Sharp. She is going to see what Dr. Toni Arthurs comes up with as far as medications are concerned. Follow-up by: Romualdo Bolk, CMA Duncan Dull),  April 01, 2010 3:53 PM

## 2011-01-14 NOTE — Consult Note (Signed)
Summary: [facility]  [facility]   Imported By: Maryln Gottron 05/20/2010 12:19:26  _____________________________________________________________________  External Attachment:    Type:   Image     Comment:   External Document

## 2011-01-14 NOTE — Assessment & Plan Note (Signed)
Summary: cpx/njr   Vital Signs:  Patient profile:   60 year old female Menstrual status:  postmenopausal Height:      60.5 inches Weight:      116 pounds Pulse rate:   60 / minute BP sitting:   130 / 80  (right arm) Cuff size:   regular  Vitals Entered By: Romualdo Bolk, CMA (AAMA) (March 27, 2010 10:02 AM) CC: CPX with pap- Pt wants to discuss one breast is bigger than the other   History of Present Illness: Adriana Figueroa comesin comes in today  for preventive visit and pap. but has many concerns about her medications chronic  pain and sleep. THinks breast on r bigger than other but no change over year s. No dc and no pain or lumps  Since last visit  here  there have been no major changes in health status  . GERD:   On nexium but worried about metabolic effects but when tries off gets burning pain signs . She has been on this for a long time?   ? had endo at one point .?  Psych: depression   : went off zyrepxa cause she though it "messed up her lipids"   Wants to go off wellbutrin in fact wants to go off all meds!      Was put on this regimen byt dr B in the past as had se of others and didnt sleep  and got paranoid accord ing to her  husband if not on something.  She has apparently been on a number of meds or sleep and  saw psych in the remote past.   She  is under care for chronic pain back and  also knee . Sees Ddr Lajoyce Corners.   to go back .  Pain affects her mobioity.   Bone health : had reclast last year and wants anoth dexa and reclast.  Believe  had se of fosamax with her Gopredicament.  Lipids- no se ? of meds.   Preventive Care Screening  Colonoscopy:    Date:  12/16/2003    Results:  normal   Prior Values:    Last Tetanus Booster:  Tdap (11/19/2009)   Preventive Screening-Counseling & Management  Alcohol-Tobacco     Alcohol drinks/day: 0     Smoking Status: never  Caffeine-Diet-Exercise     Caffeine use/day: 2     Does Patient Exercise: yes     Type of  exercise: treadmill     Exercise (avg: min/session): 5  Hep-HIV-STD-Contraception     Sun Exposure-Excessive: no  Safety-Violence-Falls     Seat Belt Use: yes     Smoke Detectors: yes  EKG  Procedure date:  03/27/2010  Findings:      Normal sinus rhythm with rate of:  91  Current Medications (verified): 1)  Wellbutrin Xl 300 Mg Xr24h-Tab (Bupropion Hcl) .Marland Kitchen.. 1 By Mouth Qd 2)  Nexium 40 Mg Cpdr (Esomeprazole Magnesium) .Marland Kitchen.. 1 By Mouth Once Daily 3)  Pravastatin Sodium 40 Mg Tabs (Pravastatin Sodium) .Marland Kitchen.. 1 By Mouth Once Daily 4)  Cyanocobalamin 1000 Mcg/ml Soln (Cyanocobalamin) .... Monthly 5)  Vicodin Hp 10-660 Mg Tabs (Hydrocodone-Acetaminophen) .Marland Kitchen.. 1 By Mouth Three Times A Day For Back and Knee Pain 6)  Reclast 5 Mg/145ml Soln (Zoledronic Acid)  Allergies (verified): No Known Drug Allergies  Past History:  Past medical, surgical, family and social histories (including risk factors) reviewed, and no changes noted (except as noted below).  Past Medical History: Depression  decrease sleep. GERD on nexium since 1989  reported having EGD  report not availabe. Hyperlipidemia Osteoporosis Melasma   on med  Primiparous  Colonscopy 2005   Past Surgical History: Bunionectomy on Left in 2009 Dislocated Rt shoulder 2008 Back surgeon.  Ne   Past History:  Care Management: Dermatology: [place] Orthopedics: Lajoyce Corners  Family History: Reviewed history from 11/19/2009 and no changes required. Father: Deceased from Brain aneurysm Mother: HBP, osteoposis, kidney failure Siblings:  Healthy  Social History: Reviewed history from 11/19/2009 and no changes required. Occupation: Homemaker  BS  husband has MS   Married Never Smoked Alcohol use-no Drug use-no Regular exercise-yes No pets  [place]  Is a vegetarian  eats dairy and takes vitamins. From [place]   originally.  Seat Belt Use:  yes Sun Exposure-Excessive:  no  Review of Systems  The patient  denies anorexia, fever, weight loss, weight gain, vision loss, decreased hearing, hoarseness, chest pain, syncope, dyspnea on exertion, peripheral edema, prolonged cough, hemoptysis, abdominal pain, melena, hematochezia, hematuria, transient blindness, unusual weight change, abnormal bleeding, enlarged lymph nodes, and breast masses.         hard dto sit from back pain  and radiation down leg Physical Exam General Appearance: well developed, well nourished, no acute distress  interview laying on table as hurts her to sit up on exam table   Eyes: conjunctiva and lids normal, PERRLA, EOMI,  WNL Ears, Nose, Mouth, Throat: TM clear, nares clear, oral exam WNL Neck: supple, no lymphadenopathy, no thyromegaly, no JVD Respiratory: clear to auscultation and percussion, respiratory effort normal Cardiovascular: regular rate and rhythm, S1-S2, no murmur, rub or gallop, no bruits, peripheral pulses normal and symmetric, no cyanosis, clubbing, edema or varicosities Chest: no scars, masses, tenderness; bareley perceptable asymmetry, skin changes, nipple discharge   ? from scoliosis and truncal posture. Gastrointestinal: soft, non-tender; no hepatosplenomegaly, masses; active bowel sounds all quadrants, guaiac negative stool; no masses, tenderness, hemorrhoids  Genitourinary: no vaginal discharge, lesions; no masses or tenderness pap done  Lymphatic: no cervical, axillary or inguinal adenopathy Musculoskeletal:  extremely tender to touch her lower back  no lesion seen   gait normal, muscle tone and strength ? WNL hasd to assess , no joint swelling, effusions, discoloration, crepitus  Skin: clear, good turgor, WNL, no rashes, lesions, or ulcerations Neurologic: normal mental status, normal reflexes, normal strength, sensation, and motion Psychiatric: alert; oriented to person, place and time Other Exam:  see labs  EKG NSR  rate 90     Impression & Recommendations:  Problem # 1:  HEALTH MAINTENANCE EXAM,  ADULT (ICD-V70.0)  Discussed nutrition,exercise,diet,healthy weight, vitamin D and calcium.  i dont think breast are abnormal or masses today get mammo  Orders: EKG w/ Interpretation (93000)  Problem # 2:  ROUTINE GYNECOLOGICAL EXAM (ICD-V72.31)  pap done  Orders: Pap Smear, Thin Prep ( Collection of) (E4540)  Problem # 3:  HYPERLIPIDEMIA (ICD-272.4)  Her updated medication list for this problem includes:    Pravastatin Sodium 40 Mg Tabs (Pravastatin sodium) .Marland Kitchen... 1 by mouth once daily  Problem # 4:  BACK PAIN W SCOLIOSIS (ICD-724.5) chronic pain and on narcotic meds   for controll agree  follow up with specailist  consider other  intervention as  appropriae Her updated medication list for this problem includes:    Vicodin Hp 10-660 Mg Tabs (Hydrocodone-acetaminophen) .Marland Kitchen... 1 by mouth three times a day for back and knee pain  Problem # 5:  GERD (ICD-530.81)  ongoing  wants to go off med but has symptom   . disc wean to prevent rebound and then follow up  Her updated medication list for this problem includes:    Nexium 40 Mg Cpdr (Esomeprazole magnesium) .Marland Kitchen... 1 by mouth once daily    Ranitidine Hcl 150 Mg Tabs (Ranitidine hcl) .Marland Kitchen... 1 by mouth two times a day  Problem # 6:  DEPRESSION (ICD-311) ? dx unclear  if could be bipolar ore   other  variant  Her updated medication list for this problem includes:    Wellbutrin Xl 300 Mg Xr24h-tab (Bupropion hcl) .Marland Kitchen... 1 by mouth qd  Problem # 7:  SLEEP DISORDER/DISTURBANCE (ICD-780.50)  on going   xyprexa helped some but wore off and she doesnt want to be on meds but asks for help with sleep  Complete Medication List: 1)  Wellbutrin Xl 300 Mg Xr24h-tab (Bupropion hcl) .Marland Kitchen.. 1 by mouth qd 2)  Nexium 40 Mg Cpdr (Esomeprazole magnesium) .Marland Kitchen.. 1 by mouth once daily 3)  Pravastatin Sodium 40 Mg Tabs (Pravastatin sodium) .Marland Kitchen.. 1 by mouth once daily 4)  Cyanocobalamin 1000 Mcg/ml Soln (Cyanocobalamin) .... Monthly 5)  Vicodin Hp 10-660 Mg Tabs  (Hydrocodone-acetaminophen) .Marland Kitchen.. 1 by mouth three times a day for back and knee pain 6)  Reclast 5 Mg/147ml Soln (Zoledronic acid) 7)  Ranitidine Hcl 150 Mg Tabs (Ranitidine hcl) .Marland Kitchen.. 1 by mouth two times a day  Patient Instructions: 1)  will let your know about the pap smear. 2)  Your EKG is normal. 3)  Ok to stop the wellbutrin  but if you get more depressed consider  going back on a nother medication. COnsider cymbalta  as this caould help with pain.    Otherwise  can  consider  something like neurontin at night to see if helps nerve pain and thus sleep.   4)  Normal breast exam but get mammogram and if normal no intervention. 5)  Can get bone density.   and sent to [place].    then make plans for medication such as Reclast infusion for your osteoporosis.Marland Kitchen 6)  I rec trying to decrease nexium :  take ranitidine 150 by mouth 3 days a week  sych as Mon Wed Fri and nexium the other 4 days to se if we can decrease the nexium use.      7)  Would like a return visit after  your bone density is back and trying to decrease the NExium   8)  Please schedule a follow-up appointment in 1 month.  Prescriptions: RANITIDINE HCL 150 MG TABS (RANITIDINE HCL) 1 by mouth two times a day  #60 x 3   Entered and Authorized by:   Madelin Headings MD   Signed by:   Madelin Headings MD on 03/27/2010   Method used:   Electronically to        [facility]  [phone]* (retail)       [address]       [place], [place]  [address]       Ph: [phone] or [phone]       Fax: [phone]   RxID:   [id number]  prolonged visit today . TOld patient i think her needs with depression and sleep would be best with psych  and rec Dr Walker Shadow office.    she may do this.  Discussed risk benefit  of all types of meds.   Appended  Document: cpx/njr dexa scan from 12/2018 showed t score -1.7 fem neck and  total hip -1.0 spine -.4  Nl Echo done for eval of murmur and Nl pap and EKG.

## 2011-01-14 NOTE — Assessment & Plan Note (Signed)
Summary: arm bruised/njr   Vital Signs:  Patient profile:   60 year old female Menstrual status:  postmenopausal Weight:      115 pounds Temp:     98.2 degrees F oral BP sitting:   160 / 90  (left arm) Cuff size:   regular  Vitals Entered By: Sid Falcon LPN (July 23, 2010 12:10 PM)  Serial Vital Signs/Assessments:  Time      Position  BP       Pulse  Resp  Temp     By                     150/90                         Evelena Peat MD  CC: right forearm pain   History of Present Illness: Same-day appointment. Patient seen with bruising right forearm without history of injury. This was noted 2 days ago. Also noticed a small bruise under her left breast and left abdomen. Again no history of trauma. She relates increased bleeding from the gums past few days with teeth brushing.  Denies any issues of increased fatigue. Estimated 20 pound weight loss recently (per pt) which she attributes to stress of divorce.  This amt of weight loss is NOT confirmed by recent weights here. She denies any headaches, chest pain, dyspnea, change in stools or urine habits. No aspirin use.  Consumes wine 2 glasses/day.  Nonsmoker.  Had CPE in 4/11.  Thyroid and B12 normal then. Very poor diet since separation from husband several months ago.  She states she is only eating about one half a pizza per day and very little else.  She has quit cooking since separation.  Allergies (verified): No Known Drug Allergies  Past History:  Past Medical History: Last updated: 05/08/2010 Depression  decrease sleep. GERD on nexium since 1989  reported having EGD  report not availabe. hx esophageal ulceration Hyperlipidemia Osteoporosis  by dexa and hx of fractures in the past  failed orals  Ghas GERD  last dexa  4/ 11 -1.4 -1.5 reclast Jan 2010 Melasma   on med  Primiparous  Colonscopy 2005  MRI spine 2011 [facility]   Past Surgical History: Last updated: 03/27/2010 Bunionectomy on Left in 2009 Dislocated Rt  shoulder 2008 Back surgeon.  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 From [place]   originally.   Risk Factors: Alcohol Use: 0 (05/08/2010) Caffeine Use: 0 (05/08/2010) Exercise: yes (05/08/2010)  Risk Factors: Smoking Status: never (05/08/2010) PMH-FH-SH reviewed for relevance  Review of Systems       The patient complains of weight loss.  The patient denies fever, weight gain, hoarseness, chest pain, syncope, dyspnea on exertion, peripheral edema, prolonged cough, headaches, hemoptysis, abdominal pain, melena, hematochezia, severe indigestion/heartburn, hematuria, muscle weakness, and enlarged lymph nodes.    Physical Exam  General:  Well-developed,well-nourished,in no acute distress; alert,appropriate and cooperative throughout examination Head:  Normocephalic and atraumatic without obvious abnormalities. No apparent alopecia or balding. Eyes:  pupils equal, pupils round, and pupils reactive to light.   Ears:  External ear exam  shows no significant lesions or deformities.  Otoscopic examination reveals clear canals, tympanic membranes are intact bilaterally without bulging, retraction, inflammation or discharge. Hearing is grossly normal bilaterally. Mouth:  gums appear somewhat friable but no active bleeding at this time. She has poor dentition Neck:  No deformities, masses, or tenderness noted. Lungs:  Normal respiratory effort, chest expands symmetrically. Lungs are clear to auscultation, no crackles or wheezes. Heart:  normal rate, regular rhythm, and no murmur.   Abdomen:  soft, non-tender, normal bowel sounds, no distention, no  hepatomegaly, and no splenomegaly.   Extremities:  No clubbing, cyanosis, edema, or deformity noted with normal full range of motion of all joints.   Skin:  right forearm 9 x 4 cm bruise distally and more proximally a separate bruise 4 x 2 cm. Left lower abdomen 0.5 x 1 cm area of ecchymosis Cervical Nodes:  No lymphadenopathy noted Psych:  normally interactive and good eye contact.     Impression & Recommendations:  Problem # 1:  ECCHYMOSES (ICD-782.9) I am concerned about appearance of nontraumatic ecchymoses esp. in assoc with recent bleeding from gums.  Check CBC to rule out low platelets. Orders: TLB-CBC Platelet - w/Differential (85025-[id number]) Venipuncture (42595) Specimen Handling (63875)  Problem # 2:  WEIGHT LOSS (ICD-783.21) Has documented loss since last Dec but none past few months.  ?related to stress of separation from husband.  Recent TSH normal.  Very poor dietary habits and discussed more variety in diet. Orders: TLB-BMP (Basic Metabolic Panel-BMET) (80048-METABOL) TLB-Hepatic/Liver Function Pnl (80076-HEPATIC) Venipuncture (64332) Specimen Handling (95188)  Problem # 3:  ELEVATED BLOOD PRESSURE (ICD-796.2) ?related to stress. Discussed importance of ETOH in moderation.  Reassess with primary in 2 weeks.  Problem # 4:  ADJUSTMENT DISORDER (ICD-309.9) Discussed importance of counseling and this is being set up by someone else she knows.  Complete Medication List: 1)  Wellbutrin Xl 300 Mg Xr24h-tab (Bupropion hcl) .Marland Kitchen.. 1 by mouth qd 2)  Nexium 40 Mg Cpdr (Esomeprazole magnesium) .Marland Kitchen.. 1 by mouth once daily 3)  Pravastatin Sodium 40 Mg Tabs (Pravastatin sodium) .Marland Kitchen.. 1 by mouth once daily 4)  Cyanocobalamin 1000 Mcg/ml Soln (Cyanocobalamin) .... Monthly 5)  Vicodin Hp 10-660 Mg Tabs (Hydrocodone-acetaminophen) .Marland Kitchen.. 1 by mouth three times a day for back and knee pain 6)  Reclast 5 Mg/153ml Soln (Zoledronic acid) 7)  Ranitidine Hcl 150 Mg Tabs (Ranitidine hcl) .Marland Kitchen.. 1 by  mouth two times a day 8)  Trazodone Hcl 50 Mg Tabs (Trazodone hcl) .... At night per dr Helane Rima. 9)  Promethazine Hcl 25 Mg Tabs (Promethazine hcl) .Marland Kitchen.. 1 poq4-6 hours as needed nausea 10)  Bd Luer-lok Syringe 25g X 1" 3 Ml Misc (Syringe/needle (disp)) .... Use as directed 11)  Wellbutrin Xl 300 Mg Xr24h-tab (Bupropion hcl) .Marland Kitchen.. 1 by mouth once daily 12)  Remeron 15 Mg Tabs (Mirtazapine) .Marland Kitchen.. 1 by mouth once daily  Patient Instructions: 1)  Please schedule a follow-up appointment in 2 weeks with Dr Fabian Sharp.

## 2011-01-16 NOTE — Letter (Signed)
Summary: Generic Letter  [facility]  [address] [place], [place] [address]   Phone: [phone]  Fax: [phone]    12/12/2010  Adriana Figueroa [address] [place], [place]  [address]  Dear Ms. Wandrey,  We recieved a letter from [facility] saying that you haven't had a follow up diagnostic breast evaluation. It is very important that you get this done. Please call [facility] at [phone] to schedule this appointment.           Sincerely,   Tor Netters, CMA (AAMA)  Appended Document: Generic Letter Mailed letter about this.

## 2011-01-16 NOTE — Progress Notes (Addendum)
Summary: Wt gain/thyroid problems/insomnia  Phone Note Outgoing Call   Call placed by: Romualdo Bolk, CMA Duncan Dull),  January 02, 2011 3:35 PM Call placed to: Patient Summary of Call: I spoke with pt and encouraged her to see the Endo about her weight gain and thyroid concerns. Pt states that she never went to see Dr. Horald Pollen. I spoke with her in detail about how her thyroid problem could affect both her weight and hair loss. Pt was also advised to call [facility] to get a repeat MMG that she is due for. Pt wanted [place] to rx something for sleep. I explained to her that since she is seeing Dr. Evelene Croon, that she would need to call her since she gave her something in the past for sleep but it didn't work. Pt agreed to do above. I told her that I would cancel the appt tomorrow. Order sent to [facility]. Initial call taken by: Romualdo Bolk, CMA Duncan Dull),  January 02, 2011 3:38 PM  Follow-up for Phone Call        Pt then called back and is very upset that we won't treat her thyroid condition. Spoke to pt and she is not taking any medications except her vitamins. Last seen Dr. Evelene Croon it has been a long time. Remeron doesn't work on her. Trazadone she can't take due to hallucations. I spoke to pt for telling what she needs to do and why. She promised me that she would call Dr. Carie Caddy office to get something for her sleep and to get back on her medication. Also that she would keep her appoitment with Dr. Horald Pollen once it has been made. I did make her appt for her cpx with labs in April. She was thinking that we were abanding her. I explain that we are just trying to get her the best care possible with the specialist. She asked [place] if we would rx her something for sleep. I explained that she should call Dr. Evelene Croon to see what would be best since she has already tried things before. But call me if she had any problems with this and we would do the best we could. But that I couldn't promise anything. Follow-up by: Romualdo Bolk, CMA (AAMA),  January 02, 2011 4:18 PM

## 2011-02-06 ENCOUNTER — Telehealth: Payer: Self-pay | Admitting: *Deleted

## 2011-02-06 NOTE — Telephone Encounter (Signed)
Mailed letter about dexa results.

## 2011-02-27 LAB — URINALYSIS, ROUTINE W REFLEX MICROSCOPIC
Glucose, UA: NEGATIVE mg/dL
Hgb urine dipstick: NEGATIVE
pH: 6 (ref 5.0–8.0)

## 2011-02-27 LAB — CBC
HCT: 39.3 % (ref 36.0–46.0)
MCHC: 33.1 g/dL (ref 30.0–36.0)
MCV: 97.8 fL (ref 78.0–100.0)
Platelets: 345 10*3/uL (ref 150–400)
RDW: 14.5 % (ref 11.5–15.5)
WBC: 4 10*3/uL (ref 4.0–10.5)

## 2011-02-27 LAB — COMPREHENSIVE METABOLIC PANEL
ALT: 39 U/L — ABNORMAL HIGH (ref 0–35)
AST: 49 U/L — ABNORMAL HIGH (ref 0–37)
Albumin: 3 g/dL — ABNORMAL LOW (ref 3.5–5.2)
Albumin: 3.1 g/dL — ABNORMAL LOW (ref 3.5–5.2)
Albumin: 3.3 g/dL — ABNORMAL LOW (ref 3.5–5.2)
Alkaline Phosphatase: 54 U/L (ref 39–117)
Alkaline Phosphatase: 58 U/L (ref 39–117)
BUN: 3 mg/dL — ABNORMAL LOW (ref 6–23)
BUN: 3 mg/dL — ABNORMAL LOW (ref 6–23)
CO2: 23 mEq/L (ref 19–32)
CO2: 24 mEq/L (ref 19–32)
Chloride: 108 mEq/L (ref 96–112)
Chloride: 110 mEq/L (ref 96–112)
Chloride: 113 mEq/L — ABNORMAL HIGH (ref 96–112)
Creatinine, Ser: 0.55 mg/dL (ref 0.4–1.2)
GFR calc Af Amer: >60 mL/min (ref >60)
GFR calc non Af Amer: >60 mL/min (ref >60)
GFR calc non Af Amer: >60 mL/min (ref >60)
Glucose, Bld: 76 mg/dL (ref 70–99)
Potassium: 3.5 mEq/L (ref 3.5–5.1)
Potassium: 3.6 mEq/L (ref 3.5–5.1)
Total Bilirubin: 0.6 mg/dL (ref 0.3–1.2)
Total Bilirubin: 0.7 mg/dL (ref 0.3–1.2)
Total Bilirubin: 0.9 mg/dL (ref 0.3–1.2)

## 2011-02-27 LAB — PROTIME-INR
INR: 0.88 (ref 0.00–1.49)
INR: 0.89 (ref 0.00–1.49)
INR: 0.9 (ref 0.00–1.49)
Prothrombin Time: 12.4 seconds (ref 11.6–15.2)

## 2011-02-27 LAB — DIFFERENTIAL
Eosinophils Absolute: 0.1 10*3/uL (ref 0.0–0.7)
Eosinophils Relative: 3 % (ref 0–5)
Lymphocytes Relative: 47 % — ABNORMAL HIGH (ref 12–46)
Lymphs Abs: 1.9 10*3/uL (ref 0.7–4.0)
Monocytes Relative: 7 % (ref 3–12)

## 2011-02-27 LAB — ETHANOL: Alcohol, Ethyl (B): 159 mg/dL — ABNORMAL HIGH (ref 0–10)

## 2011-02-27 LAB — ACETAMINOPHEN LEVEL: Acetaminophen (Tylenol), Serum: <10 ug/mL — ABNORMAL LOW (ref 10–30)

## 2011-02-27 LAB — APTT: aPTT: 28 seconds (ref 24–37)

## 2011-02-27 LAB — BASIC METABOLIC PANEL
BUN: 3 mg/dL — ABNORMAL LOW (ref 6–23)
Creatinine, Ser: 0.47 mg/dL (ref 0.4–1.2)
GFR calc non Af Amer: >60 mL/min (ref >60)
Glucose, Bld: 84 mg/dL (ref 70–99)
Potassium: 3.5 mEq/L (ref 3.5–5.1)

## 2011-02-27 LAB — RAPID URINE DRUG SCREEN, HOSP PERFORMED
Barbiturates: NOT DETECTED
Benzodiazepines: NOT DETECTED
Opiates: POSITIVE — AB

## 2011-02-27 LAB — SALICYLATE LEVEL: Salicylate Lvl: <4 mg/dL (ref 2.8–20.0)

## 2011-03-03 LAB — CBC
HCT: 44.4 % (ref 36.0–46.0)
Platelets: 373 10*3/uL (ref 150–400)
WBC: 5.9 10*3/uL (ref 4.0–10.5)

## 2011-03-03 LAB — DIFFERENTIAL
Lymphocytes Relative: 35 % (ref 12–46)
Lymphs Abs: 2.1 10*3/uL (ref 0.7–4.0)
Neutrophils Relative %: 59 % (ref 43–77)

## 2011-03-26 ENCOUNTER — Other Ambulatory Visit: Payer: Self-pay

## 2011-03-27 ENCOUNTER — Encounter: Payer: Self-pay | Admitting: Internal Medicine

## 2011-04-02 ENCOUNTER — Encounter: Payer: Self-pay | Admitting: Internal Medicine

## 2011-04-15 ENCOUNTER — Encounter: Payer: Self-pay | Admitting: Internal Medicine

## 2011-05-05 ENCOUNTER — Emergency Department (HOSPITAL_COMMUNITY)
Admission: EM | Admit: 2011-05-05 | Discharge: 2011-05-05 | Disposition: A | Discharge status: Home / Self Care | Payer: Managed Care, Other (non HMO) | Attending: Emergency Medicine | Admitting: Emergency Medicine

## 2011-05-05 DIAGNOSIS — IMO0001 Reserved for inherently not codable concepts without codable children: Secondary | ICD-10-CM | POA: Insufficient documentation

## 2011-05-05 DIAGNOSIS — M549 Dorsalgia, unspecified: Secondary | ICD-10-CM | POA: Insufficient documentation

## 2011-05-05 DIAGNOSIS — M545 Low back pain, unspecified: Secondary | ICD-10-CM | POA: Insufficient documentation

## 2011-05-08 ENCOUNTER — Ambulatory Visit (INDEPENDENT_AMBULATORY_CARE_PROVIDER_SITE_OTHER): Payer: Managed Care, Other (non HMO) | Admitting: Internal Medicine

## 2011-05-08 ENCOUNTER — Encounter: Payer: Self-pay | Admitting: Internal Medicine

## 2011-05-08 VITALS — BP 110/80 | Temp 98.3°F | Wt 120.0 lb

## 2011-05-08 DIAGNOSIS — E785 Hyperlipidemia, unspecified: Secondary | ICD-10-CM

## 2011-05-08 DIAGNOSIS — G479 Sleep disorder, unspecified: Secondary | ICD-10-CM

## 2011-05-08 DIAGNOSIS — F3289 Other specified depressive episodes: Secondary | ICD-10-CM

## 2011-05-08 DIAGNOSIS — D518 Other vitamin B12 deficiency anemias: Secondary | ICD-10-CM

## 2011-05-08 DIAGNOSIS — N318 Other neuromuscular dysfunction of bladder: Secondary | ICD-10-CM

## 2011-05-08 DIAGNOSIS — K219 Gastro-esophageal reflux disease without esophagitis: Secondary | ICD-10-CM

## 2011-05-08 DIAGNOSIS — N319 Neuromuscular dysfunction of bladder, unspecified: Secondary | ICD-10-CM

## 2011-05-08 DIAGNOSIS — R52 Pain, unspecified: Secondary | ICD-10-CM

## 2011-05-08 DIAGNOSIS — F329 Major depressive disorder, single episode, unspecified: Secondary | ICD-10-CM

## 2011-05-08 LAB — BASIC METABOLIC PANEL
BUN: 6 mg/dL (ref 6–23)
Chloride: 109 mEq/L (ref 96–112)
Glucose, Bld: 89 mg/dL (ref 70–99)
Potassium: 5.3 mEq/L — ABNORMAL HIGH (ref 3.5–5.1)

## 2011-05-08 LAB — CBC WITH DIFFERENTIAL/PLATELET
Basophils Absolute: 0 10*3/uL (ref 0.0–0.1)
Basophils Relative: 0.7 % (ref 0.0–3.0)
Hemoglobin: 14.9 g/dL (ref 12.0–15.0)
Lymphocytes Relative: 40.5 % (ref 12.0–46.0)
Monocytes Relative: 9.2 % (ref 3.0–12.0)
Neutro Abs: 1.9 10*3/uL (ref 1.4–7.7)
RBC: 4.28 Mil/uL (ref 3.87–5.11)
WBC: 4 10*3/uL — ABNORMAL LOW (ref 4.5–10.5)

## 2011-05-08 LAB — HEPATIC FUNCTION PANEL
ALT: 75 U/L — ABNORMAL HIGH (ref 0–35)
AST: 162 U/L — ABNORMAL HIGH (ref 0–37)
Alkaline Phosphatase: 56 U/L (ref 39–117)
Bilirubin, Direct: 0.2 mg/dL (ref 0.0–0.3)
Total Bilirubin: 0.5 mg/dL (ref 0.3–1.2)
Total Protein: 6.4 g/dL (ref 6.0–8.3)

## 2011-05-08 LAB — POCT URINALYSIS DIPSTICK
Bilirubin, UA: NEGATIVE
Blood, UA: NEGATIVE
Glucose, UA: NEGATIVE
Ketones, UA: NEGATIVE
Nitrite, UA: NEGATIVE
Spec Grav, UA: 1.005
pH, UA: 5

## 2011-05-08 LAB — LIPID PANEL
Cholesterol: 275 mg/dL — ABNORMAL HIGH (ref 0–200)
HDL: 139.7 mg/dL (ref >39.00)
Total CHOL/HDL Ratio: 2
VLDL: 19.8 mg/dL (ref 0.0–40.0)

## 2011-05-08 MED ORDER — DULOXETINE HCL 30 MG PO CPEP: ORAL_CAPSULE | ORAL | Status: AC

## 2011-05-08 MED ORDER — TRAZODONE HCL 50 MG PO TABS: 50.0000 mg | ORAL_TABLET | Freq: Every day | ORAL | Status: DC

## 2011-05-08 NOTE — Progress Notes (Signed)
Subjective:    Patient ID: Adriana Figueroa, female    DOB: [birth year], 60 y.o.   MRN: [id number]  HPI Patient comes in today because she doesn't feel well. She states that she hurts all over but was having a very hard time with her lower back and went to the emergency room by ambulance this past week  5/21  because she couldn't get out of bad. She states that this has been going on for almost each year.The emergency room doc told her she had fibromyalgia and gave her some Valium and oxycodone which she did not take. She is unsure what to do because she feels bad.She states that her legs feel heavy and sometimes it is hard to move her toes. No specific numbness fever swollen glands. She asks about medications such as Cymbalta. .    She is still going through a Nasty divorce      .  And this is very stressful for her and she is tearful at times.    No  injury   . She has some degenerative joint disease doesn't report to have serious back disease. She is been on antidepressants in the past and has seen a psychiatrist but currently she's really taking no medication except her vitamin B12 which gives monthly injections. And her Zantac which he takes twice a day to suppress her reflux.  She's having a hard time sleeping and would like a refill on her previous trazodone 50 mg. She was supposed to get her Reclast last year and apparently appointment was never made for that. No recent broken bones. Her last three class was in 2010.  Review of Systems No vomiting diarrhea recent weight loss skin changes except for some dryness some hot flashes no shortness of breath bruising or bleeding. She feels depressed but not suicidal or hopeless.She [facility].    Objective:   Physical Exam Will develop well-nourished depressed appearing but articulate Adriana Figueroa in no acute distress. She is wearing 2 inch sandals and her gait is a bit halting and slightly bent over and  painful. No focal weakness. HEENT grossly normal Chest:  Clear to A&P without wheezes rales or rhonchi CV:  S1-S2 no gallops or murmurs peripheral perfusion is normal MS no focal atrophy  or  Weakness no tremor no redness or joints  oa changes .       Assessment & Plan:  Pain  " Hurts all over" Poss fibro However there could be many other causes of her pain. Also DJD  R/o pmr, depressiive sx .   Myositis. She's not taking the cholesterol medicine that was prescribed last year. GERD: Is no longer taking the Nexium but Taking  Rantidine. With modest success.   Depression  Unclear how much is adding to this The problem of recurrent insomnia is discussed. Avoidance of caffeine sources is strongly encouraged. Sleep hygiene issues are reviewed. .  this is not new she is trying to avoid dependent producing medication okay to refill her trazodone. Can refill the trazadone.   Given by Dr Evelene Croon in the past.  Osteoporosis:   She is late on her re-classed we will deal with this when she comes back for her checkup. B12 deficiency: she's getting her own injections do not know when the last level was done  Overall she has had some disorganized care because of no-show appointments canceled appointments.Marland Kitchen disruption of her life over the last year.divorce after 35 years.  Will check laboratory studies today and begin her on low dose Cymbalta. Counseled at length about our plan and try to get through the nausea side effect to maintain on the medication. She will follow-up in about 3 to 4 weeks we can redress issues at that time. Have encouraged to to  Reconsider  counseling regarding her divorce.  Total visit 40 mins > 50% spent counseling and coordinating care .

## 2011-05-08 NOTE — Patient Instructions (Signed)
I am unsure if all your pain is from fibromyalgia  Some from arthritis. Checking your labs and thyroid today incase this is adding to your pain.  Will begin cymbalta to help with mood and pain.   Take 30 mg per day for  1-2 weeks and then  Increase to 60 mg per  Day . Will notify you  of labs when available.  We need to see you in 3-4 weeks and then plan any other follow up or med check.

## 2011-05-09 LAB — ALDOLASE: Aldolase: 11.2 U/L — ABNORMAL HIGH (ref <=8.1)

## 2011-05-14 ENCOUNTER — Telehealth: Payer: Self-pay | Admitting: *Deleted

## 2011-05-14 DIAGNOSIS — R945 Abnormal results of liver function studies: Secondary | ICD-10-CM

## 2011-05-14 DIAGNOSIS — M791 Myalgia, unspecified site: Secondary | ICD-10-CM

## 2011-05-14 NOTE — Telephone Encounter (Signed)
Pt aware of results and states that she will not drink alcohol, take cymbalta or advil/tylenol. Pt also stated that she has never seen a rheum. Before. She stated that she didn't want to worry about it and just wants to die anyway. Order sent to [facility] for rheum referral.

## 2011-05-14 NOTE — Telephone Encounter (Signed)
Message copied by Romualdo Bolk on Wed May 14, 2011  2:51 PM ------      Message from: [facility] [place] [facility]      Created: Tue May 13, 2011  7:18 PM       Advised patient that her lab work shows her liver tests are abnormal. She also has a slight elevation of the muscle enzyme.       Until we decide why this is abnormal  don't  take the Cymbalta ,  avoid  all alcohol,   Tylenol and Advil.        I would like her to see a rheumatologist again about her pain.   And these abnormal lab tests            Would still have her keep her appointment with me.   We will repeat her levels at that point.

## 2011-05-15 ENCOUNTER — Telehealth: Payer: Self-pay | Admitting: *Deleted

## 2011-05-15 NOTE — Telephone Encounter (Signed)
I rec she see get a consult from a specialist..  rheumatologist  Because  of her abnormal lab studies. Otherwise  my recommendations are documented.  Cymbalta can cause liver inflammation and her liver tests are abnormal. If a rheumatologist  Says that fibromyalgia  Is the cause of her pain then they can rec what intervention to try.In regard to her depressive sx she may want to go back and see the psychiatrist or counselor. Depression can  Make the pain worse.   If she is not satisfied with this advice  then  Would be happy to have her see another  physician and we can send copy of notes  And labs.

## 2011-05-15 NOTE — Telephone Encounter (Signed)
Pt called leaving a message saying that she is very upset that Dr. Fabian Sharp didn't address your issues. She is sweating profusely and has fibromyalgia. She was very upset on the phone.

## 2011-05-16 NOTE — Telephone Encounter (Signed)
Pt aware of this and was fine with everything. She will wait to hear from Terri about the rheum appt.

## 2011-05-16 NOTE — Telephone Encounter (Signed)
Left message on machine to call back  

## 2011-05-29 ENCOUNTER — Telehealth: Payer: Self-pay | Admitting: Internal Medicine

## 2011-05-29 NOTE — Telephone Encounter (Signed)
I spoke with pt and told her not to take the cymbalta until we hear from Dr. Fabian Sharp. I also advised her to call Dr. Carie Caddy office to see what she would recommend instead of cymbalta. I also called Dr. Carie Caddy to let them know what is going on as well. I told pt that I would send this message to Dr. Fabian Sharp and get back with her as soon as I could.

## 2011-05-29 NOTE — Telephone Encounter (Signed)
Pt called and said that Dr Dierdre Forth is wanting pt to start on Cymbalta asap. Pls advise if this is ok. Pls call. Pt also says that hot flashes are getting worse.

## 2011-06-01 NOTE — Telephone Encounter (Signed)
The issue is her unexpected  abnormal liver tests.    Repeat  LFT s with    Hep b aby ,ag and core aby, Hep C aby screen.  And get an ultrasound of the liver  Unless Dr Dierdre Forth did these tests.  If improved  then cymbalta could be helpful.( dont have to wait on the [place] to start  Cymbalta)  I need to see Dr Tawana Scale evaluation.   ? If he did these tests and are better then we can proceed.

## 2011-06-02 NOTE — Telephone Encounter (Signed)
Spoke to pt and she saw Dr. Evelene Croon- she is now taking Neurotin and tramadol.

## 2011-06-12 ENCOUNTER — Telehealth: Payer: Self-pay | Admitting: *Deleted

## 2011-06-12 ENCOUNTER — Telehealth: Payer: Self-pay | Admitting: Internal Medicine

## 2011-06-12 NOTE — Telephone Encounter (Signed)
Derm is going to call pt with an appt.

## 2011-06-12 NOTE — Telephone Encounter (Signed)
Pt's hand is turning dark around the knuckles. Pt states her hand is turning black. Pt can move hand. No tingling. Everything is normal. I told pt to call derm first to see what they can do.

## 2011-06-12 NOTE — Telephone Encounter (Signed)
Pt called req referral to Dermatologist re: discoloration of both hands and skin peeling. Pt said that she contacted derm office and was told it would be end of July before she could get ov. Pt req to get in sooner with Dermatologist.

## 2011-06-12 NOTE — Telephone Encounter (Signed)
I called Dr. Loyce Dys office to see if I could get pt in sooner. They are going to call us back to let us know. Pt aware of this.

## 2011-07-21 ENCOUNTER — Telehealth: Payer: Self-pay

## 2011-07-21 NOTE — Telephone Encounter (Signed)
Pt called and stated she has a lab appointment on the 15th and wanted to know if she needed to fast or not.  Called to make pt aware that labs are fasting.  Left a message for pt to return call.

## 2011-07-21 NOTE — Telephone Encounter (Signed)
Pt aware that labs are fasting on th 15th.

## 2011-07-29 ENCOUNTER — Telehealth: Payer: Self-pay | Admitting: *Deleted

## 2011-07-29 NOTE — Telephone Encounter (Signed)
Pt is complaining of itching all over including vaginal area with no relief from Benadryl x 2 days.  Cannot get in for appt this week.  Discussed Monistat for vaginal itching.

## 2011-07-30 ENCOUNTER — Other Ambulatory Visit (INDEPENDENT_AMBULATORY_CARE_PROVIDER_SITE_OTHER): Payer: Managed Care, Other (non HMO)

## 2011-07-30 DIAGNOSIS — Z0000 Encounter for general adult medical examination without abnormal findings: Secondary | ICD-10-CM

## 2011-07-30 LAB — POCT URINALYSIS DIPSTICK
Glucose, UA: NEGATIVE
Nitrite, UA: NEGATIVE
Protein, UA: NEGATIVE
Spec Grav, UA: 1.015
Urobilinogen, UA: 0.2

## 2011-07-30 LAB — BASIC METABOLIC PANEL
BUN: 8 mg/dL (ref 6–23)
Calcium: 9 mg/dL (ref 8.4–10.5)
GFR: 100.99 mL/min (ref >60.00)
Glucose, Bld: 101 mg/dL — ABNORMAL HIGH (ref 70–99)
Potassium: 3.7 mEq/L (ref 3.5–5.1)

## 2011-07-30 LAB — CBC WITH DIFFERENTIAL/PLATELET
Basophils Absolute: 0 10*3/uL (ref 0.0–0.1)
Eosinophils Relative: 1.8 % (ref 0.0–5.0)
HCT: 43.9 % (ref 36.0–46.0)
Lymphocytes Relative: 34.2 % (ref 12.0–46.0)
Monocytes Relative: 15.7 % — ABNORMAL HIGH (ref 3.0–12.0)
Neutrophils Relative %: 47.5 % (ref 43.0–77.0)
Platelets: 326 10*3/uL (ref 150.0–400.0)
RDW: 14.5 % (ref 11.5–14.6)
WBC: 4.3 10*3/uL — ABNORMAL LOW (ref 4.5–10.5)

## 2011-07-30 LAB — HEPATIC FUNCTION PANEL
Albumin: 3.5 g/dL (ref 3.5–5.2)
Alkaline Phosphatase: 53 U/L (ref 39–117)
Total Protein: 6.4 g/dL (ref 6.0–8.3)

## 2011-07-30 LAB — LIPID PANEL
Cholesterol: 201 mg/dL — ABNORMAL HIGH (ref 0–200)
HDL: 66.8 mg/dL (ref >39.00)
Total CHOL/HDL Ratio: 3
Triglycerides: 108 mg/dL (ref 0.0–149.0)
VLDL: 21.6 mg/dL (ref 0.0–40.0)

## 2011-07-30 LAB — TSH: TSH: 2.65 u[IU]/mL (ref 0.35–5.50)

## 2011-07-30 LAB — LDL CHOLESTEROL, DIRECT: Direct LDL: 120.5 mg/dL

## 2011-07-30 NOTE — Telephone Encounter (Signed)
We discussed that, but what does she do for her itching all over her body????

## 2011-07-30 NOTE — Telephone Encounter (Signed)
Can take benadryl 25 every 6 hours or zyrtec each day and moisturizing creams such as eucerin   But I  dont have enough information to give further advice.  Sometimes medication allergy . or narcotic medicine or even food allergies can do this.  She would need ov to decide what to do for the itching.Marland Kitchen

## 2011-07-30 NOTE — Telephone Encounter (Signed)
Per Dr. Fabian Sharp- use monistat 3 or 7 day OTC

## 2011-07-31 NOTE — Telephone Encounter (Signed)
LMTCB

## 2011-08-01 ENCOUNTER — Encounter: Payer: Self-pay | Admitting: Internal Medicine

## 2011-08-01 NOTE — Telephone Encounter (Signed)
LMTCB again

## 2011-08-06 ENCOUNTER — Encounter: Payer: Self-pay | Admitting: Internal Medicine

## 2011-08-06 ENCOUNTER — Telehealth: Payer: Self-pay | Admitting: Internal Medicine

## 2011-08-06 DIAGNOSIS — Z029 Encounter for administrative examinations, unspecified: Secondary | ICD-10-CM

## 2011-08-06 NOTE — Telephone Encounter (Signed)
Pt called re: her cpx ov today 08/06/11. Pt said that she called her insurance company to see if they would cover this appt and the insurance company told pt that they would not cover the ov today, but pt really needs this ov today.

## 2011-10-01 ENCOUNTER — Telehealth: Payer: Self-pay | Admitting: *Deleted

## 2011-10-01 NOTE — Telephone Encounter (Signed)
Pt is not seeing Dr. Evelene Croon anymore. She is wanting to know if she could increase her trazodone 50mg  to 100mg . I asked pt how often she is taking this medication. Pt states that she hasn't been taking it for awhile. So I advised her to try the 50mg  for a few weeks to see how this works then if this doesn't help after a few weeks, give [place] a call and we would ask Dr. Fabian Sharp about increasing it to 100mg .

## 2011-10-07 NOTE — Telephone Encounter (Signed)
This person should be under psychiatric care for her meds long term. Can help out in the short term.

## 2011-10-24 ENCOUNTER — Telehealth: Payer: Self-pay | Admitting: *Deleted

## 2011-10-24 NOTE — Telephone Encounter (Signed)
Left message to return call 

## 2011-10-24 NOTE — Telephone Encounter (Signed)
(  Voicemail message) pt states she is experiencing increased pain due to fibromyalgia. Pt calling for advise, please return call to patient and triage symptoms.

## 2011-10-24 NOTE — Telephone Encounter (Signed)
Pt just called and said that her "legs aren't working". I advised her to hang up with me and call 911. Pt didn't want to go to the ED because then she would have to take a cab back home. She then hung the phone on me.

## 2011-10-24 NOTE — Telephone Encounter (Signed)
Pt. Is calling to tell Dr. Fabian Sharp that the fibromyalgia is "killing her".  She hurt so bad at night, she want to go to the ER for relief.  What can she do?

## 2011-10-25 ENCOUNTER — Emergency Department (HOSPITAL_COMMUNITY)
Admission: EM | Admit: 2011-10-25 | Discharge: 2011-10-25 | Disposition: A | Discharge status: Home / Self Care | Payer: Managed Care, Other (non HMO) | Attending: Emergency Medicine | Admitting: Emergency Medicine

## 2011-10-25 DIAGNOSIS — R269 Unspecified abnormalities of gait and mobility: Secondary | ICD-10-CM | POA: Insufficient documentation

## 2011-10-25 DIAGNOSIS — M810 Age-related osteoporosis without current pathological fracture: Secondary | ICD-10-CM | POA: Insufficient documentation

## 2011-10-25 DIAGNOSIS — E785 Hyperlipidemia, unspecified: Secondary | ICD-10-CM | POA: Insufficient documentation

## 2011-10-25 DIAGNOSIS — K219 Gastro-esophageal reflux disease without esophagitis: Secondary | ICD-10-CM | POA: Insufficient documentation

## 2011-10-25 DIAGNOSIS — G8929 Other chronic pain: Secondary | ICD-10-CM | POA: Insufficient documentation

## 2011-10-25 DIAGNOSIS — F329 Major depressive disorder, single episode, unspecified: Secondary | ICD-10-CM | POA: Insufficient documentation

## 2011-10-25 DIAGNOSIS — F3289 Other specified depressive episodes: Secondary | ICD-10-CM | POA: Insufficient documentation

## 2011-10-25 DIAGNOSIS — Z79899 Other long term (current) drug therapy: Secondary | ICD-10-CM | POA: Insufficient documentation

## 2011-10-25 NOTE — ED Notes (Signed)
Pt walked back to triage on her own, even though she stated that "I cannot walk."  She refused to answer my questions when trying to assess her.  "Can't you understand?!  I just hurt all over all the time!!"  She was yelling and kept saying "Stupid [place]!"  She ended up leaving after a brief visit from the [facility] without being treated.

## 2011-10-25 NOTE — ED Notes (Signed)
Pt rude in triage, states my reviewing her history is none of my business and she states "don't rehash my past" pt states she is unable to walk, walked just fine without help to Rm 11. Pt processes to state she needs to get out of this rude country and go back home. Pt has resided in [place] 43 yrs. Pt states people here are rude and inconsiderate.

## 2011-10-25 NOTE — ED Notes (Signed)
Pt states she has Fibromyalgia, can not walk and has body aches

## 2011-10-25 NOTE — ED Provider Notes (Signed)
History     CSN: [id number] Arrival date & time: 10/25/2011 10:35 AM   First MD Initiated Contact with Patient 10/25/11 1101      Chief Complaint  Patient presents with  . unable to walk     (Consider location/radiation/quality/duration/timing/severity/associated sxs/prior treatment) HPI Comments: Patient states she has not been unable to walk for the past 10 months but makes herself walk.  Refused to answer questions about her medication regime, her primary doctor.  Stating this is "stupid [place] and we don't understand".  Refused to undress for exam and stated she was leaving and would never pay [place] for such bad service.  Than walked to waiting room with no visible limp or distress.  The history is provided by the patient.    Past Medical History  Diagnosis Date  . GERD (gastroesophageal reflux disease)     on nexium since 1989 ? had esoph ulceration reported egd not in record  . History of esophageal ulcer   . Hyperlipidemia   . Osteoporosis     by dexa in the past failed orals has GERD last dexa 4/11 -1.4, -1.5 relast Jan 2010  . Fracture     hx in the past  . 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 Primiparous   . DJD (degenerative joint disease)     MRI spine DJD 2011  ack surgery consult.  . Depression     [facility]  . Sleep disorder     decreased  . B12 deficiency     gives her own shots    Past Surgical History  Procedure Date  . Esophagogastroduodenoscopy     hx report not available  . Bunionectomy 2009    on Left  . Dislocated rt shoulder 2008  . Back surgery     Family History  Problem Relation Age of Onset  . Hypertension Mother   . Osteoporosis Mother   . Kidney failure Mother   . Aneurysm Father     Brain    History  Substance Use Topics  . Smoking status: Never Smoker   . Smokeless tobacco: Not on file  . Alcohol Use: No    OB History    Grav Para Term Preterm Abortions TAB SAB Ect Mult Living                  Review of Systems  Allergies  Vicodin  Home Medications   Current Outpatient Rx  Name Route Sig Dispense Refill  . CYANOCOBALAMIN 1000 MCG/15ML PO LIQD Intramuscular Inject 1 mL into the muscle every 30 (thirty) days.      Marland Kitchen GABAPENTIN 800 MG PO TABS Oral Take 800 mg by mouth 3 (three) times daily. 1/2-1 qid     . PROMETHAZINE HCL 25 MG PO TABS Oral Take 25 mg by mouth every 6 (six) hours as needed.      Marland Kitchen RANITIDINE HCL 150 MG PO TABS Oral Take 300 mg by mouth 2 (two) times daily.      . SYRINGE/NEEDLE (DISP) 25G X 1" 3 ML MISC Does not apply by Does not apply route.      . TRAMADOL HCL 50 MG PO TABS Oral Take 1 mg by mouth every 6 (six) hours as needed. 1-2 qid     . TRAZODONE HCL 50 MG PO TABS Oral Take 1 tablet (50 mg total) by mouth at bedtime. 30 tablet 2  . ZOLEDRONIC ACID 5 MG/100ML IV SOLN Intravenous  Inject 5 mg into the vein once.        BP 118/78  Pulse 87  Temp 98.3 F (36.8 C)  Resp 18  SpO2 100%  Physical Exam  ED Course  Procedures (including critical care time)  Labs Reviewed - No data to display No results found.   No diagnosis found.    MDM  Generalized pain disorder psychiatric conversion disorder         Arman Filter, NP 10/25/11 1435

## 2011-10-25 NOTE — ED Notes (Signed)
Pt was observed walking to her room from triage.

## 2011-10-26 NOTE — ED Provider Notes (Signed)
Medical screening examination/treatment/procedure(s) were conducted as a shared visit with non-physician practitioner(s) and myself.  I personally evaluated the patient during the encounter  Toy Baker, MD 10/26/11 [phone]

## 2011-11-07 ENCOUNTER — Emergency Department (HOSPITAL_COMMUNITY)
Admission: EM | Admit: 2011-11-07 | Discharge: 2011-11-07 | Disposition: A | Discharge status: Home / Self Care | Payer: Managed Care, Other (non HMO) | Attending: Emergency Medicine | Admitting: Emergency Medicine

## 2011-11-07 ENCOUNTER — Encounter (HOSPITAL_COMMUNITY): Payer: Self-pay | Admitting: Emergency Medicine

## 2011-11-07 DIAGNOSIS — IMO0001 Reserved for inherently not codable concepts without codable children: Secondary | ICD-10-CM | POA: Insufficient documentation

## 2011-11-07 DIAGNOSIS — M810 Age-related osteoporosis without current pathological fracture: Secondary | ICD-10-CM | POA: Insufficient documentation

## 2011-11-07 DIAGNOSIS — K219 Gastro-esophageal reflux disease without esophagitis: Secondary | ICD-10-CM | POA: Insufficient documentation

## 2011-11-07 DIAGNOSIS — Z0489 Encounter for examination and observation for other specified reasons: Secondary | ICD-10-CM | POA: Insufficient documentation

## 2011-11-07 DIAGNOSIS — E785 Hyperlipidemia, unspecified: Secondary | ICD-10-CM | POA: Insufficient documentation

## 2011-11-07 DIAGNOSIS — M1990 Unspecified osteoarthritis, unspecified site: Secondary | ICD-10-CM | POA: Insufficient documentation

## 2011-11-07 DIAGNOSIS — Z0283 Encounter for blood-alcohol and blood-drug test: Secondary | ICD-10-CM

## 2011-11-07 DIAGNOSIS — F329 Major depressive disorder, single episode, unspecified: Secondary | ICD-10-CM | POA: Insufficient documentation

## 2011-11-07 DIAGNOSIS — E538 Deficiency of other specified B group vitamins: Secondary | ICD-10-CM | POA: Insufficient documentation

## 2011-11-07 DIAGNOSIS — F3289 Other specified depressive episodes: Secondary | ICD-10-CM | POA: Insufficient documentation

## 2011-11-07 HISTORY — DX: Fibromyalgia: M79.7

## 2011-11-07 NOTE — ED Notes (Signed)
PT. REQUESTING BLOOD TEST FOR ALCOHOL ,  CHARGED WITH DUI LAST NIGHT.  UNABLE TO PERFORM BREATHALIZER TEST .  DENIES ANY PAIN OR DISCOMFORT. RESPIRATIONS UNLABORED.

## 2011-11-07 NOTE — ED Provider Notes (Addendum)
History     CSN: [id number] Arrival date & time: 11/07/2011 12:35 AM   First MD Initiated Contact with Patient 11/07/11 0129      Chief Complaint  Patient presents with  . Drug / Alcohol Assessment    (Consider location/radiation/quality/duration/timing/severity/associated sxs/prior treatment) HPI Comments: Patient received a DUI tonight, refused a breathalyzer test now in ED demanding a blood ETOH test with her attorney present. Explained that there is no chain of custody so the test would not be admissible in court but the attorney insists. Also explained that there is no medical reason for the test therfor she would be responsible for the expense.  Patient is a 60 y.o. female presenting with drug/alcohol assessment. The history is provided by the patient.  Drug / Alcohol Assessment Primary symptoms include agitation. This is a new problem. The current episode started 1 to 2 hours ago. The problem has not changed since onset.Suspected agents include alcohol.    Past Medical History  Diagnosis Date  . GERD (gastroesophageal reflux disease)     on nexium since 1989 ? had esoph ulceration reported egd not in record  . History of esophageal ulcer   . Hyperlipidemia   . Osteoporosis     by dexa in the past failed orals has GERD last dexa 4/11 -1.4, -1.5 relast Jan 2010  . Fracture     hx in the past  . 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 Primiparous   . DJD (degenerative joint disease)     MRI spine DJD 2011  ack surgery consult.  . Depression     [facility]  . Sleep disorder     decreased  . B12 deficiency     gives her own shots  . Fibromyalgia     History reviewed. No pertinent past surgical history.  Family History  Problem Relation Age of Onset  . Hypertension Mother   . Osteoporosis Mother   . Kidney failure Mother   . Aneurysm Father     Brain    History  Substance Use Topics  . Smoking status: Never Smoker   . Smokeless  tobacco: Not on file  . Alcohol Use: Yes     OCCASIONAL    OB History    Grav Para Term Preterm Abortions TAB SAB Ect Mult Living                  Review of Systems  Constitutional: Negative.  Negative for activity change.  HENT: Negative.   Eyes: Negative.   Neurological: Negative.   Hematological: Negative.   Psychiatric/Behavioral: Positive for agitation.    Allergies  Vicodin  Home Medications  No current outpatient prescriptions on file.  BP 142/89  Pulse 99  Temp(Src) 97.6 F (36.4 C) (Oral)  Resp 20  SpO2 100%  Physical Exam  Constitutional: She is oriented to person, place, and time. She appears well-developed and well-nourished.  HENT:  Head: Normocephalic.  Eyes: EOM are normal. Pupils are equal, round, and reactive to light.  Neck: Neck supple.  Pulmonary/Chest: Effort normal.  Musculoskeletal: Normal range of motion.  Neurological: She is oriented to person, place, and time.  Skin: Skin is warm.  Psychiatric: She has a normal mood and affect.    ED Course  Procedures (including critical care time)   Labs Reviewed  ETHANOL   No results found.   1. Encounter for blood-drug test       MDM  etoh  level        Arman Filter, NP 11/07/11 0149  Arman Filter, NP 11/07/11 1610  Arman Filter, NP 11/07/11 0220  Arman Filter, NP 01/01/12 [phone]

## 2011-11-07 NOTE — ED Notes (Signed)
Called lab for etoh blood draw

## 2011-11-07 NOTE — ED Notes (Signed)
Family at bedside. 

## 2011-11-07 NOTE — ED Provider Notes (Signed)
Medical screening examination/treatment/procedure(s) were performed by non-physician practitioner and as supervising physician I was immediately available for consultation/collaboration.   Carlia Bomkamp L Skai Lickteig, MD 11/07/11 0309 

## 2011-11-07 NOTE — ED Notes (Signed)
Patient requesting alcohol test for she was cited for DUI last night

## 2011-11-07 NOTE — ED Notes (Addendum)
Patient's family member is asking for names at triage because " you have been delaying the blood work for two hours,"  Patient was notified at 01:45 that NP would be order the ETOH level.

## 2012-01-02 NOTE — ED Provider Notes (Signed)
Medical screening examination/treatment/procedure(s) were performed by non-physician practitioner and as supervising physician I was immediately available for consultation/collaboration.   Britnay Magnussen L Shavonta Gossen, MD 01/02/12 1016 

## 2012-03-01 ENCOUNTER — Telehealth: Payer: Self-pay | Admitting: *Deleted

## 2012-03-01 NOTE — Telephone Encounter (Signed)
Open in error

## 2012-04-24 ENCOUNTER — Emergency Department (HOSPITAL_COMMUNITY)
Admission: EM | Admit: 2012-04-24 | Discharge: 2012-04-24 | Disposition: A | Discharge status: Home / Self Care | Payer: BC Managed Care – PPO | Attending: Emergency Medicine | Admitting: Emergency Medicine

## 2012-04-24 ENCOUNTER — Encounter (HOSPITAL_COMMUNITY): Payer: Self-pay | Admitting: Emergency Medicine

## 2012-04-24 DIAGNOSIS — IMO0001 Reserved for inherently not codable concepts without codable children: Secondary | ICD-10-CM | POA: Insufficient documentation

## 2012-04-24 DIAGNOSIS — F1010 Alcohol abuse, uncomplicated: Secondary | ICD-10-CM | POA: Insufficient documentation

## 2012-04-24 NOTE — ED Notes (Signed)
Pt c/o generalized pain d/t fibromyalgia, pt states she has no pain medication.

## 2012-04-24 NOTE — ED Notes (Signed)
Per [facility]- Pt presents with no acute dsitress- c/o of generalized pain- RX pain meds not working - therefor noncompliant- "makes her vomit".  Pt presents with ETOH present

## 2012-11-15 ENCOUNTER — Encounter: Payer: Managed Care, Other (non HMO) | Admitting: Surgery

## 2013-10-15 ENCOUNTER — Inpatient Hospital Stay (HOSPITAL_COMMUNITY)
Admission: EM | Admit: 2013-10-15 | Discharge: 2013-10-20 | DRG: 641 | Disposition: A | Discharge status: Skilled Nursing Facility | Payer: BC Managed Care – PPO | Attending: Internal Medicine | Admitting: Internal Medicine

## 2013-10-15 ENCOUNTER — Encounter (HOSPITAL_COMMUNITY): Payer: Self-pay | Admitting: Emergency Medicine

## 2013-10-15 ENCOUNTER — Emergency Department (HOSPITAL_COMMUNITY): Payer: BC Managed Care – PPO

## 2013-10-15 DIAGNOSIS — E785 Hyperlipidemia, unspecified: Secondary | ICD-10-CM | POA: Diagnosis present

## 2013-10-15 DIAGNOSIS — Z8249 Family history of ischemic heart disease and other diseases of the circulatory system: Secondary | ICD-10-CM

## 2013-10-15 DIAGNOSIS — K219 Gastro-esophageal reflux disease without esophagitis: Secondary | ICD-10-CM | POA: Diagnosis present

## 2013-10-15 DIAGNOSIS — R29898 Other symptoms and signs involving the musculoskeletal system: Secondary | ICD-10-CM

## 2013-10-15 DIAGNOSIS — F449 Dissociative and conversion disorder, unspecified: Secondary | ICD-10-CM | POA: Diagnosis present

## 2013-10-15 DIAGNOSIS — R030 Elevated blood-pressure reading, without diagnosis of hypertension: Secondary | ICD-10-CM

## 2013-10-15 DIAGNOSIS — R9431 Abnormal electrocardiogram [ECG] [EKG]: Secondary | ICD-10-CM

## 2013-10-15 DIAGNOSIS — N390 Urinary tract infection, site not specified: Secondary | ICD-10-CM | POA: Diagnosis present

## 2013-10-15 DIAGNOSIS — R339 Retention of urine, unspecified: Secondary | ICD-10-CM | POA: Diagnosis not present

## 2013-10-15 DIAGNOSIS — Z9181 History of falling: Secondary | ICD-10-CM

## 2013-10-15 DIAGNOSIS — E860 Dehydration: Secondary | ICD-10-CM | POA: Diagnosis present

## 2013-10-15 DIAGNOSIS — M1990 Unspecified osteoarthritis, unspecified site: Secondary | ICD-10-CM | POA: Diagnosis present

## 2013-10-15 DIAGNOSIS — E876 Hypokalemia: Principal | ICD-10-CM

## 2013-10-15 DIAGNOSIS — F32A Depression, unspecified: Secondary | ICD-10-CM

## 2013-10-15 DIAGNOSIS — M479 Spondylosis, unspecified: Secondary | ICD-10-CM | POA: Diagnosis present

## 2013-10-15 DIAGNOSIS — R634 Abnormal weight loss: Secondary | ICD-10-CM

## 2013-10-15 DIAGNOSIS — F3289 Other specified depressive episodes: Secondary | ICD-10-CM

## 2013-10-15 DIAGNOSIS — L659 Nonscarring hair loss, unspecified: Secondary | ICD-10-CM

## 2013-10-15 DIAGNOSIS — F329 Major depressive disorder, single episode, unspecified: Secondary | ICD-10-CM

## 2013-10-15 DIAGNOSIS — IMO0002 Reserved for concepts with insufficient information to code with codable children: Secondary | ICD-10-CM

## 2013-10-15 DIAGNOSIS — M810 Age-related osteoporosis without current pathological fracture: Secondary | ICD-10-CM | POA: Diagnosis present

## 2013-10-15 DIAGNOSIS — E538 Deficiency of other specified B group vitamins: Secondary | ICD-10-CM | POA: Diagnosis present

## 2013-10-15 DIAGNOSIS — I10 Essential (primary) hypertension: Secondary | ICD-10-CM | POA: Diagnosis present

## 2013-10-15 DIAGNOSIS — Z8262 Family history of osteoporosis: Secondary | ICD-10-CM

## 2013-10-15 DIAGNOSIS — Z23 Encounter for immunization: Secondary | ICD-10-CM

## 2013-10-15 DIAGNOSIS — IMO0001 Reserved for inherently not codable concepts without codable children: Secondary | ICD-10-CM | POA: Diagnosis present

## 2013-10-15 LAB — COMPREHENSIVE METABOLIC PANEL
AST: 32 U/L (ref 0–37)
Albumin: 3.1 g/dL — ABNORMAL LOW (ref 3.5–5.2)
Calcium: 9 mg/dL (ref 8.4–10.5)
Chloride: 95 mEq/L — ABNORMAL LOW (ref 96–112)
Creatinine, Ser: 0.55 mg/dL (ref 0.50–1.10)
Total Bilirubin: 0.9 mg/dL (ref 0.3–1.2)
Total Protein: 7 g/dL (ref 6.0–8.3)

## 2013-10-15 LAB — URINALYSIS, ROUTINE W REFLEX MICROSCOPIC
Glucose, UA: NEGATIVE mg/dL
Hgb urine dipstick: NEGATIVE
Ketones, ur: 15 mg/dL — AB
Protein, ur: NEGATIVE mg/dL
pH: 5.5 (ref 5.0–8.0)

## 2013-10-15 LAB — CBC WITH DIFFERENTIAL/PLATELET
Eosinophils Absolute: 0.1 10*3/uL (ref 0.0–0.7)
Eosinophils Relative: 1 % (ref 0–5)
HCT: 43.6 % (ref 36.0–46.0)
Hemoglobin: 15.6 g/dL — ABNORMAL HIGH (ref 12.0–15.0)
Lymphs Abs: 1.5 10*3/uL (ref 0.7–4.0)
MCH: 34.6 pg — ABNORMAL HIGH (ref 26.0–34.0)
MCV: 96.7 fL (ref 78.0–100.0)
Monocytes Absolute: 0.6 10*3/uL (ref 0.1–1.0)
Monocytes Relative: 9 % (ref 3–12)
RBC: 4.51 MIL/uL (ref 3.87–5.11)

## 2013-10-15 LAB — CK TOTAL AND CKMB (NOT AT ARMC)
CK, MB: 0.8 ng/mL (ref 0.3–4.0)
Relative Index: INVALID (ref 0.0–2.5)

## 2013-10-15 LAB — TSH: TSH: 2.2 u[IU]/mL (ref 0.350–4.500)

## 2013-10-15 LAB — URINE MICROSCOPIC-ADD ON

## 2013-10-15 LAB — CK: Total CK: 16 U/L (ref 7–177)

## 2013-10-15 MED ORDER — ACETAMINOPHEN 650 MG RE SUPP: 650.0000 mg | Freq: Four times a day (QID) | RECTAL | Status: DC | PRN

## 2013-10-15 MED ORDER — HEPARIN SODIUM (PORCINE) 5000 UNIT/ML IJ SOLN
5000.0000 [IU] | Freq: Three times a day (TID) | INTRAMUSCULAR | Status: DC
  Administered 2013-10-15 – 2013-10-20 (×13): 5000 [IU] via SUBCUTANEOUS
  Filled 2013-10-15 (×19): qty 1

## 2013-10-15 MED ORDER — MAGNESIUM SULFATE 40 MG/ML IJ SOLN
2.0000 g | Freq: Once | INTRAMUSCULAR | Status: AC
  Administered 2013-10-15: 2 g via INTRAVENOUS
  Filled 2013-10-15: qty 50

## 2013-10-15 MED ORDER — IBUPROFEN 200 MG PO TABS
400.0000 mg | ORAL_TABLET | Freq: Once | ORAL | Status: AC
  Administered 2013-10-15: 400 mg via ORAL
  Filled 2013-10-15: qty 2

## 2013-10-15 MED ORDER — SODIUM CHLORIDE 0.9 % IV SOLN
INTRAVENOUS | Status: DC
  Administered 2013-10-15 – 2013-10-18 (×6): via INTRAVENOUS
  Administered 2013-10-19: 1000 mL via INTRAVENOUS
  Administered 2013-10-19 – 2013-10-20 (×2): via INTRAVENOUS

## 2013-10-15 MED ORDER — POTASSIUM CHLORIDE CRYS ER 20 MEQ PO TBCR
60.0000 meq | EXTENDED_RELEASE_TABLET | Freq: Four times a day (QID) | ORAL | Status: AC
  Administered 2013-10-15 (×2): 60 meq via ORAL
  Filled 2013-10-15 (×2): qty 3

## 2013-10-15 MED ORDER — DEXTROSE 5 % IV SOLN
1.0000 g | INTRAVENOUS | Status: DC
  Administered 2013-10-16 – 2013-10-20 (×5): 1 g via INTRAVENOUS
  Filled 2013-10-15 (×5): qty 10

## 2013-10-15 MED ORDER — SODIUM CHLORIDE 0.9 % IV BOLUS (SEPSIS)
500.0000 mL | Freq: Once | INTRAVENOUS | Status: AC
  Administered 2013-10-15: 500 mL via INTRAVENOUS

## 2013-10-15 MED ORDER — DEXTROSE 5 % IV SOLN
1.0000 g | Freq: Once | INTRAVENOUS | Status: AC
  Administered 2013-10-15: 1 g via INTRAVENOUS
  Filled 2013-10-15: qty 10

## 2013-10-15 MED ORDER — ONDANSETRON HCL 4 MG/2ML IJ SOLN
4.0000 mg | Freq: Four times a day (QID) | INTRAMUSCULAR | Status: DC | PRN
  Administered 2013-10-18: 16:00:00 4 mg via INTRAVENOUS
  Filled 2013-10-15: qty 2

## 2013-10-15 MED ORDER — POTASSIUM CHLORIDE 10 MEQ/100ML IV SOLN
10.0000 meq | Freq: Once | INTRAVENOUS | Status: AC
  Administered 2013-10-15: 10 meq via INTRAVENOUS
  Filled 2013-10-15: qty 100

## 2013-10-15 MED ORDER — POTASSIUM CHLORIDE CRYS ER 20 MEQ PO TBCR
40.0000 meq | EXTENDED_RELEASE_TABLET | Freq: Once | ORAL | Status: AC
  Administered 2013-10-15: 40 meq via ORAL
  Filled 2013-10-15: qty 2

## 2013-10-15 MED ORDER — ACETAMINOPHEN 325 MG PO TABS
650.0000 mg | ORAL_TABLET | Freq: Four times a day (QID) | ORAL | Status: DC | PRN
  Administered 2013-10-16 (×3): 650 mg via ORAL
  Filled 2013-10-15 (×3): qty 2

## 2013-10-15 MED ORDER — ONDANSETRON HCL 4 MG PO TABS
4.0000 mg | ORAL_TABLET | Freq: Four times a day (QID) | ORAL | Status: DC | PRN
  Administered 2013-10-15: 23:00:00 4 mg via ORAL
  Filled 2013-10-15: qty 1

## 2013-10-15 MED ORDER — SODIUM CHLORIDE 0.9 % IJ SOLN
3.0000 mL | Freq: Two times a day (BID) | INTRAMUSCULAR | Status: DC
  Administered 2013-10-17 – 2013-10-19 (×3): 3 mL via INTRAVENOUS

## 2013-10-15 MED ORDER — PANTOPRAZOLE SODIUM 40 MG PO TBEC
40.0000 mg | DELAYED_RELEASE_TABLET | Freq: Every day | ORAL | Status: DC
  Administered 2013-10-15 – 2013-10-20 (×6): 40 mg via ORAL
  Filled 2013-10-15 (×7): qty 1

## 2013-10-15 MED ORDER — SODIUM CHLORIDE 0.9 % IV SOLN
INTRAVENOUS | Status: AC
  Administered 2013-10-15: 12:00:00 via INTRAVENOUS

## 2013-10-15 NOTE — Progress Notes (Signed)
10/15/13 1222 patient arrived from ED to Rm11 with Dx of Hypokalemia. Diet regular ,placed on tele, made comfortable in bed.

## 2013-10-15 NOTE — ED Notes (Signed)
Patient states she lives at home by herself.   Patient states she has been having leg/hip pain for over a month.   Patient states "i scoot around on my butt at home to go to the door.   Im on the couch all the time and keep crackers there.   I can't take anything pain like ibuprofen, or aleve cause it gives me heartburn".   Patient states she doesn't want narcotics or opioids, but claims she can't take anything else.    Per EMS, patient told them she can't eat and wants to be fed through an IV.

## 2013-10-15 NOTE — H&P (Signed)
[facility] History and Physical  Adriana Figueroa ZOX:[id number] DOB: 1951-05-07 DOA: 10/15/2013  Referring physician: Jodi Mourning PCP: Lorretta Harp, MD  Specialists:   Chief Complaint: Unable to ambulate  HPI: Adriana Figueroa is a 62 y.o. female with past medical history of GERD and osteoporosis came into the hospital because of inability to walk. Patient said for the past 3-4 weeks she's been weak, unable to walk around, this was progressively getting worse since started. Yesterday she got to the point she fell about 5 times without hitting her head. She came in today for further evaluation. In the ED initial evaluation showed potassium of 2.8, EKG showed prolonged QTC of 512, patient is so weak to the point she couldn't stand up and her urinalysis was consistent with UTI.  Review of Systems:  Constitutional: Generalized weakness, weight loss Eyes: negative for irritation, redness and visual disturbance Ears, nose, mouth, throat, and face: negative for earaches, epistaxis, nasal congestion and sore throat Respiratory: negative for cough, dyspnea on exertion, sputum and wheezing Cardiovascular: negative for chest pain, dyspnea, lower extremity edema, orthopnea, palpitations and syncope Gastrointestinal: negative for abdominal pain, constipation, diarrhea, melena, nausea and vomiting Genitourinary:negative for dysuria, frequency and hematuria Hematologic/lymphatic: negative for bleeding, easy bruising and lymphadenopathy Musculoskeletal: Muscle weakness, pain around her thighs Neurological: negative for coordination problems, gait problems, headaches and weakness Endocrine: negative for diabetic symptoms including polydipsia, polyuria and weight loss Allergic/Immunologic: negative for anaphylaxis, hay fever and urticaria   Past Medical History  Diagnosis Date  . GERD (gastroesophageal reflux disease)     on nexium since 1989 ? had esoph ulceration reported egd not in record  .  History of esophageal ulcer   . Hyperlipidemia   . Osteoporosis     by dexa in the past failed orals has GERD last dexa 4/11 -1.4, -1.5 relast Jan 2010  . Fracture     hx in the past  . 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 Primiparous   . DJD (degenerative joint disease)     MRI spine DJD 2011  ack surgery consult.  . Depression     [facility]  . Sleep disorder     decreased  . B12 deficiency     gives her own shots  . Fibromyalgia    History reviewed. No pertinent past surgical history. Social History:  reports that she has never smoked. She does not have any smokeless tobacco history on file. She reports that she drinks alcohol. She reports that she does not use illicit drugs. Lives alone at home.  Allergies  Allergen Reactions  . Penicillins   . Vicodin [Hydrocodone-Acetaminophen]     GI upset    Family History  Problem Relation Age of Onset  . Hypertension Mother   . Osteoporosis Mother   . Kidney failure Mother   . Aneurysm Father     Brain   Prior to Admission medications   Not on File   Physical Exam: Filed Vitals:   10/15/13 1050  BP: 137/85  Pulse: 93  Temp: 98.2 F (36.8 C)  Resp: 16  General appearance: alert, cooperative and no distress  Head: Normocephalic, without obvious abnormality, atraumatic  Eyes: conjunctivae/corneas clear. PERRL, EOM's intact. Fundi benign.  Nose: Nares normal. Septum midline. Mucosa normal. No drainage or sinus tenderness.  Throat: lips, mucosa, and tongue normal; teeth and gums normal  Neck: Supple, no masses, no cervical lymphadenopathy, no JVD appreciated, no meningeal signs Resp: clear  to auscultation bilaterally  Chest wall: no tenderness  Cardio: regular rate and rhythm, S1, S2 normal, no murmur, click, rub or gallop  GI: soft, non-tender; bowel sounds normal; no masses, no organomegaly  Extremities: extremities normal, atraumatic, no cyanosis or edema  Skin: Skin color, texture, turgor  normal. No rashes or lesions  Neurologic: Alert and oriented X 3, normal strength and tone.   Labs on Admission:  Basic Metabolic Panel:  Recent Labs Lab 10/15/13 0901  NA 137  K 2.8*  CL 95*  CO2 27  GLUCOSE 105*  BUN 9  CREATININE 0.55  CALCIUM 9.0  MG 1.7   Liver Function Tests:  Recent Labs Lab 10/15/13 0901  AST 32  ALT 20  ALKPHOS 91  BILITOT 0.9  PROT 7.0  ALBUMIN 3.1*   No results found for this basename: LIPASE, AMYLASE,  in the last 168 hours No results found for this basename: AMMONIA,  in the last 168 hours CBC:  Recent Labs Lab 10/15/13 0901  WBC 6.3  NEUTROABS 4.1  HGB 15.6*  HCT 43.6  MCV 96.7  PLT 390   Cardiac Enzymes:  Recent Labs Lab 10/15/13 1114  CKTOTAL 12  CKMB 0.8    BNP (last 3 results) No results found for this basename: PROBNP,  in the last 8760 hours CBG: No results found for this basename: GLUCAP,  in the last 168 hours  Radiological Exams on Admission: Mr Lumbar Spine Wo Contrast  10/15/2013   CLINICAL DATA:  Low back pain with left leg weakness and difficulty walking for 1 month. No previous relevant surgery.  EXAM: MRI LUMBAR SPINE WITHOUT CONTRAST  TECHNIQUE: Multiplanar, multisequence MR imaging was performed. No intravenous contrast was administered.  COMPARISON:  Lumbar MRI 02/10/2010.  FINDINGS: Five lumbar type vertebral bodies are assumed. There is a mild convex right scoliosis. A hemangioma is noted within the right aspect of the L3 vertebral body. There are no suspicious marrow lesions. There is no evidence of acute fracture or pars defect.  The conus medullaris extends to the L1-2 level and appears normal. No paraspinal abnormalities are identified. Moderate distention of the urinary bladder is noted.  There are no significant disc space findings from T11-12 through L1-2.  L2-3: Stable broad-based right lateral disc protrusion. No L2 nerve root encroachment is demonstrated currently. There is mild facet and  ligamentous hypertrophy.  L3-4: Stable disc bulging and left foraminal/extraforaminal disc protrusion. This contacts the left L3 nerve root laterally within the foramen and mildly narrows the left lateral recess. There is no right-sided nerve root encroachment.  L4-5: Stable broad-based left foraminal disc protrusion with mild resulting left lateral recess and left foraminal stenosis. There is mild facet and ligamentous hypertrophy.  L5-S1: Stable mild disc bulging, facet and ligament hypertrophy. No significant spinal stenosis or nerve root encroachment.  IMPRESSION: No acute findings or significant changes are demonstrated compared with the prior study from 2011. There are broad-based foraminal and extraforaminal disc protrusions on the left at L3-4 and L4-5 contributing to only mild foraminal and lateral recess stenosis as described.   Electronically Signed   By: Roxy Horseman M.D.   On: 10/15/2013 10:43    EKG: Independently reviewed.   Assessment/Plan Principal Problem:   Hypokalemia Active Problems:   Depression   Weight loss   Leg weakness, bilateral   Prolonged QT interval   UTI (urinary tract infection)   UTI -Urinalysis consistent with UTI, started empirically on Rocephin. -Urine culture is pending, just antibiotics  according to culture results.  Hypokalemia -Unclear etiology, patient is not on diuretics, denies any recent diarrhea or vomiting. -We will replete with oral supplementation. -Check BMP in a.m.  Lower extremities weakness -Likely multifactorial secondary to hypokalemia and UTI. Check TSH and CPK to rule out muscular involvement. -Correct the hypokalemia with oral supplements, treat the UTI with antibiotics. - PT/OT to evaluate and treat. If patient unable to ambulate,? Needs placement.  Prolonged QT interval -Likely secondary to hypokalemia, EKG showed QT of 447 and QTc of 512 ms. -Correct hypokalemia, give magnesium.   Code Status: Full code Family  Communication: Plan discussed with patient. Disposition Plan: Inpatient, telemetry  Time spent: 70 minutes  [facility] A [facility] Pager [phone]  If 7PM-7AM, please contact night-coverage [email or web address] Password [facility] 10/15/2013, 12:17 PM

## 2013-10-15 NOTE — ED Provider Notes (Signed)
CSN: [id number]     Arrival date & time 10/15/13  4098 History   First MD Initiated Contact with Patient 10/15/13 [phone]     Chief Complaint  Patient presents with  . gait problems   . Leg Pain   (Consider location/radiation/quality/duration/timing/severity/associated sxs/prior Treatment) HPI Comments: 62 yo female with lipid, gerd, htn, wt loss hx presents with worsening leg weakness bilateral for 1 month.  Pt lives alone, has housekeeper to help.  Gradually worsening. No back hx, fevers, urinary changes.  No hx of similar.  No new medicines. Husband divorced her recently, she has been depressed.    Patient is a 62 y.o. female presenting with leg pain. The history is provided by the patient.  Leg Pain Associated symptoms: no back pain, no fever and no neck pain     Past Medical History  Diagnosis Date  . GERD (gastroesophageal reflux disease)     on nexium since 1989 ? had esoph ulceration reported egd not in record  . History of esophageal ulcer   . Hyperlipidemia   . Osteoporosis     by dexa in the past failed orals has GERD last dexa 4/11 -1.4, -1.5 relast Jan 2010  . Fracture     hx in the past  . 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 Primiparous   . DJD (degenerative joint disease)     MRI spine DJD 2011  ack surgery consult.  . Depression     [facility]  . Sleep disorder     decreased  . B12 deficiency     gives her own shots  . Fibromyalgia    History reviewed. No pertinent past surgical history. Family History  Problem Relation Age of Onset  . Hypertension Mother   . Osteoporosis Mother   . Kidney failure Mother   . Aneurysm Father     Brain   History  Substance Use Topics  . Smoking status: Never Smoker   . Smokeless tobacco: Not on file  . Alcohol Use: Yes     Comment: OCCASIONAL   OB History   Grav Para Term Preterm Abortions TAB SAB Ect Mult Living                 Review of Systems  Constitutional: Positive for appetite  change. Negative for fever and chills.  HENT: Negative for congestion.   Eyes: Negative for visual disturbance.  Respiratory: Negative for shortness of breath.   Cardiovascular: Negative for chest pain.  Gastrointestinal: Negative for vomiting and abdominal pain.  Genitourinary: Negative for dysuria and flank pain.  Musculoskeletal: Positive for arthralgias. Negative for back pain, neck pain and neck stiffness.  Skin: Negative for rash.  Neurological: Positive for weakness. Negative for light-headedness, numbness and headaches.    Allergies  Penicillins and Vicodin  Home Medications  No current outpatient prescriptions on file. BP 141/92  Pulse 97  Temp(Src) 97.6 F (36.4 C) (Oral)  Resp 16  Ht 5' (1.524 m)  Wt 100 lb (45.36 kg)  BMI 19.53 kg/m2  SpO2 99% Physical Exam  Nursing note and vitals reviewed. Constitutional: She is oriented to person, place, and time. She appears well-developed and well-nourished.  HENT:  Head: Normocephalic and atraumatic.  Eyes: Conjunctivae are normal. Right eye exhibits no discharge. Left eye exhibits no discharge.  Neck: Normal range of motion. Neck supple. No tracheal deviation present.  Cardiovascular: Normal rate and regular rhythm.   Pulmonary/Chest: Effort normal and breath  sounds normal.  Abdominal: Soft. She exhibits no distension. There is no tenderness. There is no guarding.  Musculoskeletal: She exhibits no edema.  Neurological: She is alert and oriented to person, place, and time. No cranial nerve deficit. GCS eye subscore is 4. GCS verbal subscore is 5. GCS motor subscore is 6.  4/5 weakness bilateral LE worse with hip flexion and knee flexion 5/5 with plantar and dorsiflexion bilateral great toe/ foot Pulses intact LE  Skin: Skin is warm. No rash noted.  Psychiatric: She has a normal mood and affect.    ED Course  Procedures (including critical care time) Labs Review Labs Reviewed  COMPREHENSIVE METABOLIC PANEL - Abnormal;  Notable for the following:    Potassium 2.8 (*)    Chloride 95 (*)    Glucose, Bld 105 (*)    Albumin 3.1 (*)    All other components within normal limits  CBC WITH DIFFERENTIAL - Abnormal; Notable for the following:    Hemoglobin 15.6 (*)    MCH 34.6 (*)    All other components within normal limits  URINALYSIS, ROUTINE W REFLEX MICROSCOPIC - Abnormal; Notable for the following:    Color, Urine ORANGE (*)    APPearance CLOUDY (*)    Bilirubin Urine MODERATE (*)    Ketones, ur 15 (*)    Nitrite POSITIVE (*)    Leukocytes, UA MODERATE (*)    All other components within normal limits  URINE MICROSCOPIC-ADD ON - Abnormal; Notable for the following:    Squamous Epithelial / LPF MANY (*)    Bacteria, UA MANY (*)    All other components within normal limits  URINE CULTURE  MAGNESIUM   Imaging Review Mr Lumbar Spine Wo Contrast  10/15/2013   CLINICAL DATA:  Low back pain with left leg weakness and difficulty walking for 1 month. No previous relevant surgery.  EXAM: MRI LUMBAR SPINE WITHOUT CONTRAST  TECHNIQUE: Multiplanar, multisequence MR imaging was performed. No intravenous contrast was administered.  COMPARISON:  Lumbar MRI 02/10/2010.  FINDINGS: Five lumbar type vertebral bodies are assumed. There is a mild convex right scoliosis. A hemangioma is noted within the right aspect of the L3 vertebral body. There are no suspicious marrow lesions. There is no evidence of acute fracture or pars defect.  The conus medullaris extends to the L1-2 level and appears normal. No paraspinal abnormalities are identified. Moderate distention of the urinary bladder is noted.  There are no significant disc space findings from T11-12 through L1-2.  L2-3: Stable broad-based right lateral disc protrusion. No L2 nerve root encroachment is demonstrated currently. There is mild facet and ligamentous hypertrophy.  L3-4: Stable disc bulging and left foraminal/extraforaminal disc protrusion. This contacts the left L3  nerve root laterally within the foramen and mildly narrows the left lateral recess. There is no right-sided nerve root encroachment.  L4-5: Stable broad-based left foraminal disc protrusion with mild resulting left lateral recess and left foraminal stenosis. There is mild facet and ligamentous hypertrophy.  L5-S1: Stable mild disc bulging, facet and ligament hypertrophy. No significant spinal stenosis or nerve root encroachment.  IMPRESSION: No acute findings or significant changes are demonstrated compared with the prior study from 2011. There are broad-based foraminal and extraforaminal disc protrusions on the left at L3-4 and L4-5 contributing to only mild foraminal and lateral recess stenosis as described.   Electronically Signed   By: Roxy Horseman M.D.   On: 10/15/2013 10:43    EKG Interpretation     Ventricular Rate:  79 PR Interval:  154 QRS Duration: 57 QT Interval:  447 QTC Calculation: 512 R Axis:   19 Text Interpretation:  Sinus rhythm Low voltage, extremity leads Abnormal R-wave progression, early transition Nonspecific T abnormalities, lateral leads Prolonged QT interval            MDM  No diagnosis found. Leg weakness. No acute stroke findings. Concerned for bilateral for metabolic vs anatomic. MRI lumbar. K low, qt prolonged. IV and po given. Pt unable to walk due to weakness. Discussed with [facility], accepted tele.  Rocephin for abx for UTI.  Fluids given.  The patients results and plan were reviewed and discussed.   Any x-rays performed were personally reviewed by myself.  MRI no acute findings.  Differential diagnosis were considered with the presenting HPI.  Diagnosis: QT prolonged, Leg weakness, Dehydration, UTI  EKG:  Admission/ observation were discussed with the admitting physician, patient and/or family and they are comfortable with the plan.    Enid Skeens, MD 10/15/13 1134

## 2013-10-16 ENCOUNTER — Other Ambulatory Visit: Payer: Self-pay

## 2013-10-16 DIAGNOSIS — R9431 Abnormal electrocardiogram [ECG] [EKG]: Secondary | ICD-10-CM

## 2013-10-16 DIAGNOSIS — F329 Major depressive disorder, single episode, unspecified: Secondary | ICD-10-CM

## 2013-10-16 DIAGNOSIS — E876 Hypokalemia: Principal | ICD-10-CM

## 2013-10-16 DIAGNOSIS — R29898 Other symptoms and signs involving the musculoskeletal system: Secondary | ICD-10-CM

## 2013-10-16 LAB — URINE CULTURE: Colony Count: 60000

## 2013-10-16 LAB — URINALYSIS, ROUTINE W REFLEX MICROSCOPIC
Bilirubin Urine: NEGATIVE
Leukocytes, UA: NEGATIVE
Protein, ur: NEGATIVE mg/dL
Urobilinogen, UA: 0.2 mg/dL (ref 0.0–1.0)

## 2013-10-16 LAB — CBC
HCT: 39.1 % (ref 36.0–46.0)
MCH: 34.4 pg — ABNORMAL HIGH (ref 26.0–34.0)
Platelets: 346 10*3/uL (ref 150–400)
RDW: 15.3 % (ref 11.5–15.5)
WBC: 4.9 10*3/uL (ref 4.0–10.5)

## 2013-10-16 LAB — BASIC METABOLIC PANEL
Calcium: 7.3 mg/dL — ABNORMAL LOW (ref 8.4–10.5)
Chloride: 109 mEq/L (ref 96–112)
Creatinine, Ser: 0.5 mg/dL (ref 0.50–1.10)
GFR calc Af Amer: >90 mL/min (ref >90)
GFR calc non Af Amer: >90 mL/min (ref >90)

## 2013-10-16 MED ORDER — IBUPROFEN 400 MG PO TABS
400.0000 mg | ORAL_TABLET | Freq: Four times a day (QID) | ORAL | Status: DC | PRN
  Administered 2013-10-16 – 2013-10-19 (×2): 400 mg via ORAL
  Filled 2013-10-16 (×2): qty 1

## 2013-10-16 MED ORDER — IBUPROFEN 400 MG PO TABS
400.0000 mg | ORAL_TABLET | Freq: Once | ORAL | Status: AC
  Administered 2013-10-16: 400 mg via ORAL
  Filled 2013-10-16: qty 1

## 2013-10-16 MED ORDER — INFLUENZA VAC SPLIT QUAD 0.5 ML IM SUSP
0.5000 mL | INTRAMUSCULAR | Status: AC
  Administered 2013-10-17: 10:00:00 0.5 mL via INTRAMUSCULAR
  Filled 2013-10-16: qty 0.5

## 2013-10-16 NOTE — Progress Notes (Signed)
Utilization Review Completed.Adriana Figueroa T11/01/2013  

## 2013-10-16 NOTE — Progress Notes (Signed)
Chaplain responded to referral from nurse concerning patient who is depressed from divorce. Chaplain introduced herself and the patient began to talk about her divorce and how it is still a very present source of pain for her. She stated that there has been no contact between her and her ex-husband and that this is very difficult for her as they had been married for 35 years. Patient discussed how she has kept her house "how he loved it," but is now considering giving away some of his things, except that she doesn't have the strength to do it herself. She also stated that she doesn't walk or go outside the house. Despite this, she expressed that she is ready to be healthy again but doesn't know what to do. Patient also said that she thought the divorce was her fault. When asked why, she said that she "had always been that way," and thinks "everything" is her fault. 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 Patient could see the inconsistency in this thinking. Chaplain practiced active/reflective listening and empathic presence. Chaplain also recommended that patient pursue professional counseling.

## 2013-10-16 NOTE — Progress Notes (Signed)
Patient keeps making statements that she wants to be completely knocked out and keeps asking for MD Elmahi to be paged to receive that order. Patient also states that she is unable to move her hands and feet, was witnessed by the staff texting on cell phone and playing games on it. Patient states that she believes that her hand, leg and feet pain is a curse from God because she married a Muslim man and that she is being punished. This information was given to MD [facility]. Will continue to monitor.  Eupha Lobb J. Lendell Caprice RN

## 2013-10-16 NOTE — Progress Notes (Signed)
[facility] PROGRESS NOTE  Adriana Figueroa AVW:[id number] DOB: September 19, 1951 DOA: 10/15/2013 PCP: Lorretta Harp, MD  HPI/Subjective: Feels better, complains about soreness and achiness in her lower extremity  Assessment/Plan: Principal Problem:   Hypokalemia Active Problems:   Depression   Weight loss   Leg weakness, bilateral   Prolonged QT interval   UTI (urinary tract infection)   UTI  -Urinalysis consistent with UTI, started empirically on Rocephin.  -Urine culture is pending, just antibiotics according to culture results.   Hypokalemia  -Unclear etiology, patient is not on diuretics, denies any recent diarrhea or vomiting.  -Potassium is corrected by oral supplementation. -Repeat BMP in a.m.  Lower extremities weakness  -Likely multifactorial secondary to hypokalemia and UTI. MRI of lumbar spine showed no acute findings  -Has normal total CPK and TSH, she is not on statin. -Correct the hypokalemia with oral supplements, treat the UTI with antibiotics.  - PT/OT to evaluate and treat. If patient unable to ambulate,? Needs placement.   Prolonged QT interval  -Likely secondary to hypokalemia, EKG showed QT of 447 and QTc of 512 ms.  -Repeat EKG showed improvement.  Code Status: Full code  Family Communication: Plan discussed with patient.  Disposition Plan: Inpatient, PT/OT to evaluate, questionable placement   Code Status: Full code Family Communication: Plan discussed with the patient. Disposition Plan: Remains inpatient   Consultants:  None  Procedures:  None  Antibiotics:  Rocephin   Objective: Filed Vitals:   10/15/13 2100  BP: 125/86  Pulse: 85  Temp: 98.2 F (36.8 C)  Resp: 18    Intake/Output Summary (Last 24 hours) at 10/16/13 1047 Last data filed at 10/16/13 0500  Gross per 24 hour  Intake 2045.83 ml  Output    450 ml  Net 1595.83 ml   Filed Weights   10/15/13 0828 10/15/13 1234  Weight: 45.36 kg (100 lb) 46.3 kg (102 lb  1.2 oz)    Exam: General: Alert and awake, oriented x3, not in any acute distress. HEENT: anicteric sclera, pupils reactive to light and accommodation, EOMI CVS: S1-S2 clear, no murmur rubs or gallops Chest: clear to auscultation bilaterally, no wheezing, rales or rhonchi Abdomen: soft nontender, nondistended, normal bowel sounds, no organomegaly Extremities: no cyanosis, clubbing or edema noted bilaterally Neuro: Cranial nerves II-XII intact, no focal neurological deficits  Data Reviewed: Basic Metabolic Panel:  Recent Labs Lab 10/15/13 0901 10/16/13 0604  NA 137 139  K 2.8* 4.8  CL 95* 109  CO2 27 22  GLUCOSE 105* 97  BUN 9 4*  CREATININE 0.55 0.50  CALCIUM 9.0 7.3*  MG 1.7 2.1   Liver Function Tests:  Recent Labs Lab 10/15/13 0901  AST 32  ALT 20  ALKPHOS 91  BILITOT 0.9  PROT 7.0  ALBUMIN 3.1*   No results found for this basename: LIPASE, AMYLASE,  in the last 168 hours No results found for this basename: AMMONIA,  in the last 168 hours CBC:  Recent Labs Lab 10/15/13 0901 10/16/13 0604  WBC 6.3 4.9  NEUTROABS 4.1  --   HGB 15.6* 13.8  HCT 43.6 39.1  MCV 96.7 97.5  PLT 390 346   Cardiac Enzymes:  Recent Labs Lab 10/15/13 1114 10/15/13 1530  CKTOTAL 12 16  CKMB 0.8  --    BNP (last 3 results) No results found for this basename: PROBNP,  in the last 8760 hours CBG: No results found for this basename: GLUCAP,  in the last 168 hours  Micro No  results found for this or any previous visit (from the past 240 hour(s)).   Studies: Mr Lumbar Spine Wo Contrast  10/15/2013   CLINICAL DATA:  Low back pain with left leg weakness and difficulty walking for 1 month. No previous relevant surgery.  EXAM: MRI LUMBAR SPINE WITHOUT CONTRAST  TECHNIQUE: Multiplanar, multisequence MR imaging was performed. No intravenous contrast was administered.  COMPARISON:  Lumbar MRI 02/10/2010.  FINDINGS: Five lumbar type vertebral bodies are assumed. There is a mild  convex right scoliosis. A hemangioma is noted within the right aspect of the L3 vertebral body. There are no suspicious marrow lesions. There is no evidence of acute fracture or pars defect.  The conus medullaris extends to the L1-2 level and appears normal. No paraspinal abnormalities are identified. Moderate distention of the urinary bladder is noted.  There are no significant disc space findings from T11-12 through L1-2.  L2-3: Stable broad-based right lateral disc protrusion. No L2 nerve root encroachment is demonstrated currently. There is mild facet and ligamentous hypertrophy.  L3-4: Stable disc bulging and left foraminal/extraforaminal disc protrusion. This contacts the left L3 nerve root laterally within the foramen and mildly narrows the left lateral recess. There is no right-sided nerve root encroachment.  L4-5: Stable broad-based left foraminal disc protrusion with mild resulting left lateral recess and left foraminal stenosis. There is mild facet and ligamentous hypertrophy.  L5-S1: Stable mild disc bulging, facet and ligament hypertrophy. No significant spinal stenosis or nerve root encroachment.  IMPRESSION: No acute findings or significant changes are demonstrated compared with the prior study from 2011. There are broad-based foraminal and extraforaminal disc protrusions on the left at L3-4 and L4-5 contributing to only mild foraminal and lateral recess stenosis as described.   Electronically Signed   By: Roxy Horseman M.D.   On: 10/15/2013 10:43    Scheduled Meds: . cefTRIAXone (ROCEPHIN)  IV  1 g Intravenous Q24H  . heparin  5,000 Units Subcutaneous Q8H  . [START ON 10/17/2013] influenza vac split quadrivalent PF  0.5 mL Intramuscular Tomorrow-1000  . pantoprazole  40 mg Oral Daily  . sodium chloride  3 mL Intravenous Q12H   Continuous Infusions: . sodium chloride 100 mL/hr at 10/16/13 0230       Time spent: 35 minutes    [facility] A  [facility] Pager [phone] If  7PM-7AM, please contact night-coverage at [email or web address], password [facility] 10/16/2013, 10:47 AM  LOS: 1 day

## 2013-10-16 NOTE — Consult Note (Signed)
Reason for Consult: Inability to walk Referring Physician: Dr. Arthor Captain  CC: Lower extremity weakness  HPI: Adriana Figueroa is a pleasant but unfortunate 62 y.o. female who was admitted to [facility] hospital today due to pain and inability to walk. The patient reports that the symptoms of been going on for quite some time. They came on suddenly in May of 2011 when the patient's husband suddenly left her after 35 years of marriage. She was under a great deal of stress at that time. He filed for divorce in February of 2013. Her symptoms have progressed since the initial separation and according to the patient, at this time, are quite severe. She states she has no one to depend on. She has a brother who lives in [facility] [place]; however, he is unable to assist her. At one point she went to a [facility] facility where she was evaluated. The patient was unable to give any specifics but states that they were not able to help her. She has taken Tylenol and ibuprofen for the pain without relief. Her urine drug screen was positive for opiates. She was also noted to have a urinary tract infection and hypokalemia.  The patient states that she falls frequently. She tries to get around her house by holding on to things. She feels her arms are also weak and painful but the majority of her symptoms are in her lower extremities; from her hips down to her toes. She has been unable to find anything that really alleviates the pain and there does not appear to be any precipitating factors other than her separation from her husband. At this point she does not feel that she is capable of ambulating even short distances. Neurology has been asked to evaluate the patient.  Past Medical History  Diagnosis Date  . GERD (gastroesophageal reflux disease)     on nexium since 1989 ? had esoph ulceration reported egd not in record  . History of esophageal ulcer   . Hyperlipidemia   . Osteoporosis     by dexa in the past failed orals has GERD  last dexa 4/11 -1.4, -1.5 relast Jan 2010  . Fracture     hx in the past  . 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 Primiparous   . DJD (degenerative joint disease)     MRI spine DJD 2011  ack surgery consult.  . Depression     [facility]  . Sleep disorder     decreased  . B12 deficiency     gives her own shots  . Fibromyalgia     History reviewed. No pertinent past surgical history.  Family History  Problem Relation Age of Onset  . Hypertension Mother   . Osteoporosis Mother   . Kidney failure Mother   . Aneurysm Father     Brain    Social History:  reports that she has never smoked. She does not have any smokeless tobacco history on file. She reports that she drinks alcohol. She reports that she does not use illicit drugs.  Allergies  Allergen Reactions  . Penicillins   . Vicodin [Hydrocodone-Acetaminophen]     GI upset    Medications:  Scheduled: . cefTRIAXone (ROCEPHIN)  IV  1 g Intravenous Q24H  . heparin  5,000 Units Subcutaneous Q8H  . [START ON 10/17/2013] influenza vac split quadrivalent PF  0.5 mL Intramuscular Tomorrow-1000  . pantoprazole  40 mg Oral Daily  . sodium chloride  3 mL Intravenous Q12H    ROS: History obtained from the patient  General ROS: negative for - chills, fatigue, fever, night sweats, weight gain or weight loss Psychological ROS: negative for - behavioral disorder, hallucinations, memory difficulties, mood swings or suicidal ideation Ophthalmic ROS: negative for - blurry vision, double vision, eye pain or loss of vision ENT ROS: negative for - epistaxis, nasal discharge, oral lesions, sore throat, tinnitus or vertigo Allergy and Immunology ROS: negative for - hives or itchy/watery eyes Hematological and Lymphatic ROS: negative for - bleeding problems, bruising or swollen lymph nodes Endocrine ROS: negative for - galactorrhea, hair pattern changes, polydipsia/polyuria or temperature intolerance Respiratory  ROS: negative for - cough, hemoptysis, shortness of breath or wheezing Cardiovascular ROS: negative for - chest pain, dyspnea on exertion, edema or irregular heartbeat Gastrointestinal ROS: negative for - abdominal pain, diarrhea, hematemesis, nausea/vomiting or stool incontinence Genito-Urinary ROS: negative for - dysuria, hematuria, incontinence or urinary frequency/urgency Musculoskeletal ROS: negative for - joint swelling. Positive for muscular weakness and pain LEs greater than UEs.  Neurological ROS: as noted in HPI Dermatological ROS: negative for rash and skin lesion changes   Physical Examination: Blood pressure 102/69, pulse 87, temperature 98.1 F (36.7 C), temperature source Oral, resp. rate 18, height 5' (1.524 m), weight 46.3 kg (102 lb 1.2 oz), SpO2 98.00%.  General - somewhat anxious 62 year old female lying in bed in no acute distress. Heart - Regular rate and rhythm - no murmer Lungs - Clear to auscultation Abdomen - Soft - diffuse mild tenderness. Extremities - distal pulses intact - no edema. Skin - Warm and dry  Neurologic Examination  Mental Status: Alert, oriented, thought content appropriate.  Speech fluent without evidence of aphasia.  Able to follow 3 step commands without difficulty. Cranial Nerves: II: Discs not visualized; Visual fields grossly normal, pupils equal, round, reactive to light and accommodation III,IV, VI: ptosis not present, extra-ocular motions intact bilaterally V,VII: smile symmetric, facial light touch sensation normal bilaterally VIII: hearing normal bilaterally IX,X: gag reflex present XI: bilateral shoulder shrug XII: midline tongue extension Motor: Right : Upper extremity   5/5    Left:     Upper extremity   5/5  Lower extremity   3-4/5     Lower extremity   3-4/5 Tone and bulk:normal tone throughout; no atrophy noted Sensory: Pinprick and light touch intact throughout, bilaterally Deep Tendon Reflexes: 1+ in all  areas Plantars: Right: Mute   Left: mute Cerebellar: normal finger-to-nose, normal rapid alternating movements and normal heel-to-shin test Gait: Deferred CV: pulses palpable throughout     Laboratory Studies:   Basic Metabolic Panel:  Recent Labs Lab 10/15/13 0901 10/16/13 0604  NA 137 139  K 2.8* 4.8  CL 95* 109  CO2 27 22  GLUCOSE 105* 97  BUN 9 4*  CREATININE 0.55 0.50  CALCIUM 9.0 7.3*  MG 1.7 2.1    Liver Function Tests:  Recent Labs Lab 10/15/13 0901  AST 32  ALT 20  ALKPHOS 91  BILITOT 0.9  PROT 7.0  ALBUMIN 3.1*   No results found for this basename: LIPASE, AMYLASE,  in the last 168 hours No results found for this basename: AMMONIA,  in the last 168 hours  CBC:  Recent Labs Lab 10/15/13 0901 10/16/13 0604  WBC 6.3 4.9  NEUTROABS 4.1  --   HGB 15.6* 13.8  HCT 43.6 39.1  MCV 96.7 97.5  PLT 390 346    Cardiac Enzymes:  Recent Labs Lab 10/15/13  1114 10/15/13 1530  CKTOTAL 12 16  CKMB 0.8  --     BNP: No components found with this basename: POCBNP,   CBG: No results found for this basename: GLUCAP,  in the last 168 hours  Microbiology: No results found for this or any previous visit.  Coagulation Studies: No results found for this basename: LABPROT, INR,  in the last 72 hours  Urinalysis:  Recent Labs Lab 10/15/13 0932  COLORURINE ORANGE*  LABSPEC 1.024  PHURINE 5.5  GLUCOSEU NEGATIVE  HGBUR NEGATIVE  BILIRUBINUR MODERATE*  KETONESUR 15*  PROTEINUR NEGATIVE  UROBILINOGEN 1.0  NITRITE POSITIVE*  LEUKOCYTESUR MODERATE*    Lipid Panel:     Component Value Date/Time   CHOL 201* 07/30/2011 0856   TRIG 108.0 07/30/2011 0856   HDL 66.80 07/30/2011 0856   CHOLHDL 3 07/30/2011 0856   VLDL 21.6 07/30/2011 0856    HgbA1C:  No results found for this basename: HGBA1C    Urine Drug Screen:     Component Value Date/Time   LABOPIA POSITIVE* 09/19/2010 0053   COCAINSCRNUR NONE DETECTED 09/19/2010 0053   LABBENZ NONE  DETECTED 09/19/2010 0053   AMPHETMU NONE DETECTED 09/19/2010 0053   THCU NONE DETECTED 09/19/2010 0053   LABBARB  Value: NONE DETECTED        DRUG SCREEN FOR MEDICAL PURPOSES ONLY.  IF CONFIRMATION IS NEEDED FOR ANY PURPOSE, NOTIFY LAB WITHIN 5 DAYS.        LOWEST DETECTABLE LIMITS FOR URINE DRUG SCREEN Drug Class       Cutoff (ng/mL) Amphetamine      1000 Barbiturate      200 Benzodiazepine   200 Tricyclics       300 Opiates          300 Cocaine          300 THC              50 09/19/2010 0053    Alcohol Level: No results found for this basename: ETH,  in the last 168 hours  Other results: EKG: Sinus rhythm rate 81 beats per minute  Imaging:  Mr Lumbar Spine Wo Contrast 10/15/2013    No acute findings or significant changes are demonstrated compared with the prior study from 2011. There are broad-based foraminal and extraforaminal disc protrusions on the left at L3-4 and L4-5 contributing to only mild foraminal and lateral recess stenosis as described.      Assessment/Plan:  62 year old female presenting with severe pain and weakness of both lower extremities with mild pain and weakness of her upper extremities. Onset of symptoms occurred when her husband suddenly left her without warning in May of 2011 after 35 years of marriage. Her symptoms have progressed since that time. A psychiatric consult they need to be considered.   Delton See, [facility]  Patient was personally evaluated by me including neurologic examination. Weakness of lower extremities appears to be functional. Suspect significant psychophysiologic factors involved in her symptomatology. Recommend physical therapy evaluation and management, as well as consideration for psychiatry consultation for conversion reaction.  Venetia Maxon M.D. [facility] [phone]

## 2013-10-16 NOTE — Evaluation (Signed)
Physical Therapy Evaluation Patient Details Name: Adriana Figueroa MRN: [id number] DOB: [birth year] Today's Date: 10/16/2013 Time: 9629-5284 PT Time Calculation (min): 20 min  PT Assessment / Plan / Recommendation History of Present Illness  62 y.o. female admitted to [facility] with with past medical history of GERD and osteoporosis came into the hospital because of inability to walk.  She was found to be hypokalemic and urine was + for UTI.  MRI of lumbar spine with no significant changes from MRI in 2011.    Clinical Impression  Pt does not want to work with PT.  "I am not going to do any rehab until my pain is better!"  Pt reports that the motrin she is taking is not helping with her leg pain.  I was able to assist her to [facility] to get a sense of her strength.  She is very deconditioned.  I read the MRI report and her symptoms do not seem to be consistent with the MRI results.  She refuses to walk due to fear of falling and pain.  I explained to her that she is in no shape to return home and live by herself.  I explained SNF for rehab and she stated again, "I cannot do rehab.  I cannot walk right now!"  I tried to explain to her that just starting to move would be rehab, but she was not open to my explainations.  She asked the RN tech if she would have to hire around the clock help for home.  I am not sure how successful we will be in treating her acutely or how she got into this shape.  She did mention a nasty divorce- maybe some of this is emotional.  Would she benefit from psych consult?   PT to follow acutely for deficits listed below.       PT Assessment  Patient needs continued PT services    Follow Up Recommendations  SNF    Does the patient have the potential to tolerate intense rehabilitation     NA  Barriers to Discharge Decreased caregiver support;Inaccessible home environment pt has no one to help her and lives in a multi level home     Equipment Recommendations  Rolling walker with 5"  wheels;Wheelchair (measurements PT);Wheelchair cushion (measurements PT);3in1 (PT)    Recommendations for Other Services   Psych consult?  Frequency Min 3X/week    Precautions / Restrictions Precautions Precautions: Fall Precaution Comments: bil leg weakness and pain.     Pertinent Vitals/Pain 10/10 bil leg pain     Mobility  Bed Mobility Bed Mobility: Supine to Sit;Sitting - Scoot to Edge of Bed;Sit to Supine Supine to Sit: 4: Min assist;With rails;HOB elevated Sitting - Scoot to Edge of Bed: 4: Min guard;With rail Sit to Supine: 4: Min assist;With rail;HOB flat Details for Bed Mobility Assistance: min assist to support trunk to get to EOB, pt able to slide legs over without assist.   Transfers Transfers: Sit to Stand;Stand to Sit;Stand Pivot Transfers Sit to Stand: 3: Mod assist;With upper extremity assist;With armrests;From bed;From chair/3-in-1 Stand to Sit: 3: Mod assist;With upper extremity assist;With armrests;To bed;To chair/3-in-1 Stand Pivot Transfers: 3: Mod assist;With armrests Details for Transfer Assistance: mod assist to support trunk over weak legs.  Pt is anxious and fearful of falling.  She wanted two person assist to get to 3-in-1, but really only needed one person assist.   Ambulation/Gait Ambulation/Gait Assistance: Other (comment) (pt refused to try due to pain)  PT Diagnosis: Difficulty walking;Abnormality of gait;Generalized weakness;Acute pain  PT Problem List: Decreased strength;Decreased activity tolerance;Decreased balance;Decreased mobility;Decreased knowledge of use of DME;Impaired sensation;Pain PT Treatment Interventions: DME instruction;Gait training;Stair training;Functional mobility training;Therapeutic activities;Therapeutic exercise;Balance training;Neuromuscular re-education;Patient/family education;Modalities     PT Goals(Current goals can be found in the care plan section) Acute Rehab PT Goals Patient Stated Goal: to decrease the  pain in her legs PT Goal Formulation: With patient Time For Goal Achievement: 10/30/13 Potential to Achieve Goals: Good  Visit Information  Last PT Received On: 10/16/13 Assistance Needed: +2 (for safety if trying to walk) History of Present Illness: 62 y.o. female admitted to [facility] with with past medical history of GERD and osteoporosis came into the hospital because of inability to walk.  She was found to be hypokalemic and urine was + for UTI.  MRI of lumbar spine with no significant changes from MRI in 2011.         Prior Functioning  Home Living Family/patient expects to be discharged to:: Private residence Living Arrangements: Alone Available Help at Discharge: Other (Comment) (none per pt report, no children, brother lives in [place]) Type of Home: House Home Layout: Two level;Bed/bath upstairs Home Equipment: None Additional Comments: Per pt report she has been washing off with a washcloth, she gets food when she can.  She is divorced and has a brother who lives in [place] who per her report "could care less" Prior Function Comments: Apparently not functioning well at home.  History of multiple falls, 5- the day prior to admission.   Communication Communication: No difficulties    Cognition  Cognition Arousal/Alertness: Awake/alert Behavior During Therapy: Agitated (mildly) Overall Cognitive Status: Within Functional Limits for tasks assessed    Extremity/Trunk Assessment Upper Extremity Assessment Upper Extremity Assessment: Defer to OT evaluation Lower Extremity Assessment Lower Extremity Assessment: RLE deficits/detail;LLE deficits/detail RLE Sensation: decreased light touch (per pt report since she has been in the hospital- tingling) LLE Deficits / Details: bil legs very weak 3-/5 throughout.  Functionally, she can take some weight over flexed knees in standing, but is very weak and at risk for knees buckling.  There seems to be no side that is stronger or weaker than the  other side.   LLE Sensation: decreased light touch (per pt report since she has been in the hospital- tingling) Cervical / Trunk Assessment Cervical / Trunk Assessment: Other exceptions Cervical / Trunk Exceptions: s- curve noted in sitting in spine-most pronounced in lumbar area   Balance Balance Balance Assessed: Yes Static Sitting Balance Static Sitting - Balance Support: Bilateral upper extremity supported;Feet supported Static Sitting - Level of Assistance: 5: Stand by assistance Static Standing Balance Static Standing - Balance Support: Bilateral upper extremity supported Static Standing - Level of Assistance: 3: Mod assist Dynamic Standing Balance Dynamic Standing - Balance Support: Bilateral upper extremity supported Dynamic Standing - Level of Assistance: 3: Mod assist  End of Session PT - End of Session Activity Tolerance: Patient limited by fatigue;Patient limited by pain Patient left: in bed;with call bell/phone within reach    Cochiti B. Marlan Steward, PT, DPT [phone]   10/16/2013, 1:32 PM

## 2013-10-17 DIAGNOSIS — R030 Elevated blood-pressure reading, without diagnosis of hypertension: Secondary | ICD-10-CM

## 2013-10-17 DIAGNOSIS — F4321 Adjustment disorder with depressed mood: Secondary | ICD-10-CM

## 2013-10-17 DIAGNOSIS — F449 Dissociative and conversion disorder, unspecified: Secondary | ICD-10-CM

## 2013-10-17 DIAGNOSIS — R29898 Other symptoms and signs involving the musculoskeletal system: Secondary | ICD-10-CM | POA: Diagnosis present

## 2013-10-17 LAB — BASIC METABOLIC PANEL
Calcium: 7.8 mg/dL — ABNORMAL LOW (ref 8.4–10.5)
Chloride: 105 mEq/L (ref 96–112)
GFR calc non Af Amer: >90 mL/min (ref >90)
Sodium: 137 mEq/L (ref 135–145)

## 2013-10-17 MED ORDER — MIRTAZAPINE 7.5 MG PO TABS
7.5000 mg | ORAL_TABLET | Freq: Every day | ORAL | Status: DC
  Administered 2013-10-17 – 2013-10-19 (×2): 7.5 mg via ORAL
  Filled 2013-10-17 (×4): qty 1

## 2013-10-17 MED ORDER — BOOST / RESOURCE BREEZE PO LIQD
1.0000 | Freq: Two times a day (BID) | ORAL | Status: DC
  Administered 2013-10-17 – 2013-10-20 (×5): 1 via ORAL

## 2013-10-17 MED ORDER — MORPHINE SULFATE 2 MG/ML IJ SOLN
1.0000 mg | INTRAMUSCULAR | Status: DC | PRN
  Administered 2013-10-17 – 2013-10-18 (×6): 2 mg via INTRAVENOUS
  Filled 2013-10-17 (×6): qty 1

## 2013-10-17 MED ORDER — ALUM & MAG HYDROXIDE-SIMETH 200-200-20 MG/5ML PO SUSP
15.0000 mL | ORAL | Status: DC | PRN
  Administered 2013-10-17: 18:00:00 15 mL via ORAL
  Filled 2013-10-17: qty 30

## 2013-10-17 NOTE — Progress Notes (Signed)
CSW went ahead and faxed Adriana Figueroa information (with Adriana Figueroa permission) to [facility], however Adriana Figueroa unable to obtain Auth until Adriana Figueroa/OT notes are available. Adriana Figueroa was in pain (per notes) this am and will attempt to work with Adriana Figueroa/OT this evening for evaluation.   CSW will continue to follow Adriana Figueroa for d/c planning.   Leron Croak  MSW, LCSWA  [facility]

## 2013-10-17 NOTE — Progress Notes (Signed)
CSW uploaded PT Notes on [employer].   Leron Croak LCSWA  [facility]

## 2013-10-17 NOTE — Progress Notes (Signed)
CSW provided list of bed offers for SNF facilities.   Weekday CSW to f/u for bed selection and further d/c planning.    Leron Croak, LCSWA [facility]

## 2013-10-17 NOTE — Progress Notes (Signed)
Rehab Admissions Coordinator Note:  Patient was screened by Trish Mage for appropriateness for an Inpatient Acute Rehab Consult.  Noted PT/OT recommending SNF versus CIR.  At this time, we are recommending Skilled Nursing Facility.  Trish Mage 10/17/2013, 4:09 PM  I can be reached at [phone].

## 2013-10-17 NOTE — Evaluation (Signed)
Occupational Therapy Evaluation Patient Details Name: Adriana Figueroa MRN: [id number] DOB: 04/02/1951 Today's Date: 10/17/2013 Time: 1345-1430 OT Time Calculation (min): 45 min  OT Assessment / Plan / Recommendation History of present illness 62 y.o. female admitted to [facility] [place] with with past medical history of GERD and osteoporosis came into the hospital because of inability to walk.  She was found to be hypokalemic and urine was + for UTI.  MRI of lumbar spine with no significant changes from MRI in 2011.     Clinical Impression   Pt admitted with above. Pt currently with functional limitations due to the deficits listed below (see OT Problem List). Pt  more agreeable to therapy this afternoon, reports morphine has given her good pain relief (down to 8/10 BLE pain from 10/10 earlier) and she is now able to tolerate mobility. Pt able to ambulate short distance gait with +2 for safety, mod-max assist overall. Poor coordination and motor planning with involvement in of UEs, trunk and LEs. Cognition impaired but difficult to define. Memory and attention involved. Pt also had difficulty evacuating bladder during session, RN made aware. HR up to 140 during ambulation, 120s with bed mobility. Will continue to follow acutely.    Pt will benefit from skilled OT to increase their safety and independence with ADL and functional mobility for ADL to facilitate discharge to venue listed below.      OT Assessment  Patient needs continued OT Services    Follow Up Recommendations  SNF;CIR    Barriers to Discharge Decreased caregiver support    Equipment Recommendations   (TBD at next venue of care)    Recommendations for Other Services Rehab consult  Frequency  Min 2X/week    Precautions / Restrictions Precautions Precautions: Fall Precaution Comments: bil leg weakness, pain and decreased coordination.     Pertinent Vitals/Pain 8/10 BLEs    ADL  Grooming: Simulated;Performed;Minimal assistance Where  Assessed - Grooming: Supported standing;Unsupported sitting Upper Body Bathing: Simulated;Set up;Minimal assistance Lower Body Dressing: Simulated;Performed;Maximal assistance Where Assessed - Lower Body Dressing: Supported sit to stand;Supported standing;Unsupported sitting Toilet Transfer: Performed;Maximal assistance Toilet Transfer Method: Sit to stand;Stand pivot Toilet Transfer Equipment: Bedside commode Toileting - Clothing Manipulation and Hygiene: Simulated;Performed;Minimal assistance Equipment Used: Gait belt;Rolling walker Transfers/Ambulation Related to ADLs: +2 with ambulate, patient fearfull of falling, required vcs for walker safety/placement, hand placement and technique. ADL Comments: Patient reports dizziness with sitting EOB them became significant with stand and short ambulation bed>BSC>sink>bed.    OT Diagnosis: Generalized weakness;Cognitive deficits;Acute pain;Ataxia  OT Problem List: Decreased strength;Decreased activity tolerance;Decreased coordination;Impaired balance (sitting and/or standing);Decreased cognition;Decreased safety awareness;Decreased knowledge of use of DME or AE;Cardiopulmonary status limiting activity;Pain OT Treatment Interventions: Self-care/ADL training;Energy conservation;DME and/or AE instruction;Therapeutic activities;Patient/family education;Balance training;Cognitive remediation/compensation   OT Goals(Current goals can be found in the care plan section) Acute Rehab OT Goals Patient Stated Goal: To take care of myself again OT Goal Formulation: With patient Time For Goal Achievement: 10/31/13 Potential to Achieve Goals: Good  Visit Information  Last OT Received On: 10/17/13 Assistance Needed: +2 History of Present Illness: 62 y.o. female admitted to [facility] with with past medical history of GERD and osteoporosis came into the hospital because of inability to walk.  She was found to be hypokalemic and urine was + for UTI.  MRI of lumbar spine  with no significant changes from MRI in 2011.         Prior Functioning     Home Living Family/patient expects to be  discharged to:: Private residence Living Arrangements: Alone Available Help at Discharge: Other (Comment) (none per pt report, no children, brother lives in [place]) Type of Home: House Home Layout: Two level;Bed/bath upstairs;1/2 bath on main level Home Equipment: None Additional Comments: Per pt report she has been washing off with a washcloth, she gets food when she can.  She is divorced and has a brother who lives in [place] who per her report "could care less"  Patient had difficult time answering questions related to PLF due to "been through alot": and I learned that people are not very reliable so I just managed.  I"I haven't been upstairs in over a year". Prior Function Comments: Apparently not functioning well at home.  History of multiple falls, 5- the day prior to admission.  Patient stated that when her pain was too great and she could not walk, she would move/scoot around on her bottom the flor Communication Communication: No difficulties Dominant Hand: Right    Cognition  Cognition Arousal/Alertness: Awake/alert Behavior During Therapy: Restless;Anxious (mild) Overall Cognitive Status: Within Functional Limits for tasks assessed (impaired attention, easily distracted) Memory: Decreased short-term memory (decreased long term memory, decreased ability to follow line of questioning related to PLF)    Extremity/Trunk Assessment Upper Extremity Assessment Upper Extremity Assessment: RUE deficits/detail;LUE deficits/detail RUE Deficits / Details: Grossly 3-3+/5, patient reports tingling and numbness in palms of hands and bottom of feet, light touch is intact, decreased speed accuracy and rhythm with FTN and isolated finger movements RUE Coordination: decreased fine motor;decreased gross motor LUE Deficits / Details: Grossly 3-3+/5, patient reports tingling and  numbness in palms of hands and bottom of feet, light touch is intact, decreased speed accuracy and rhythm with FTN and isolated finger movements LUE Coordination: decreased fine motor;decreased gross motor Lower Extremity Assessment Lower Extremity Assessment: Defer to PT evaluation     Mobility Bed Mobility Bed Mobility: Supine to Sit;Sitting - Scoot to Edge of Bed Supine to Sit: 4: Min assist;With rails;HOB elevated Sitting - Scoot to Edge of Bed: 4: Min guard;With rail Sit to Supine: 5: Supervision;HOB flat Details for Bed Mobility Assistance: min assist to support trunk to get to EOB, pt able to slide legs over without assist.   Transfers Sit to Stand: With upper extremity assist;With armrests;From bed;From chair/3-in-1;2: Max assist (+2 for safety and pt comfort) Stand to Sit: With upper extremity assist;With armrests;To bed;To chair/3-in-1;2: Max assist Details for Transfer Assistance: mod assist to obtain full standing secondary to weak legs, repeated cues needed for UE placement.  Pt is anxious and fearful of falling.  "You're holding onto me right?!"       Balance Balance Balance Assessed: Yes Static Sitting Balance Static Sitting - Balance Support: Bilateral upper extremity supported;Feet supported Static Sitting - Level of Assistance: 5: Stand by assistance Static Standing Balance Static Standing - Balance Support: Bilateral upper extremity supported Static Standing - Level of Assistance: 3: Mod assist Dynamic Standing Balance Dynamic Standing - Balance Support: Bilateral upper extremity supported;No upper extremity supported Dynamic Standing - Level of Assistance: 4: Min assist;3: Mod assist Dynamic Standing - Balance Activities: Forward lean/weight shifting;Reaching for objects;Reaching across midline Dynamic Standing - Comments: Pt anxious and resistent to let go of RW at times.    End of Session OT - End of Session Equipment Utilized During Treatment: Gait belt;Rolling  walker Activity Tolerance: Patient limited by pain;Patient tolerated treatment well;Patient limited by fatigue (reports dizziness) Patient left: in bed;with call bell/phone within reach  GO  Synthia Fairbank 10/17/2013, 3:36 PM

## 2013-10-17 NOTE — Consult Note (Signed)
Reason for Consult: Conversion disorder Referring Physician: Clydia Llano, MD   Adriana Figueroa is an 62 y.o. female.  HPI: Adriana Figueroa is a 62 y.o. female with history of depression but no medication. She has been sad, anxious, worried and not taking care of her health since she was divorced Feb 2013. She has no children. She has no out patient psychiatrist or therapist at this time. She has disturbance of sleep and appetite. . She has past history of seeking Dr. Evelene Croon and don't remember taking any specific medications.   Mental Status Examination: Patient appeared as per his stated age, lying down in hospital bed on her back and awake, alert and oriented, and fairly groomed. She has a good eye contact. Patient has unhappy mood and her affect was appropriate. He has normal rate, rhythm, and volume of speech. His thought process is linear and goal directed. Patient has denied suicidal, homicidal ideations, intentions or plans. Patient has no evidence of auditory or visual hallucinations, delusions, and paranoia. Patient has fair insight judgment and impulse control.  Past Medical History  Diagnosis Date  . GERD (gastroesophageal reflux disease)     on nexium since 1989 ? had esoph ulceration reported egd not in record  . History of esophageal ulcer   . Hyperlipidemia   . Osteoporosis     by dexa in the past failed orals has GERD last dexa 4/11 -1.4, -1.5 relast Jan 2010  . Fracture     hx in the past  . 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 Primiparous   . DJD (degenerative joint disease)     MRI spine DJD 2011  ack surgery consult.  . Depression     [facility]  . Sleep disorder     decreased  . B12 deficiency     gives her own shots  . Fibromyalgia     History reviewed. No pertinent past surgical history.  Family History  Problem Relation Age of Onset  . Hypertension Mother   . Osteoporosis Mother   . Kidney failure Mother   . Aneurysm Father     Brain     Social History:  reports that she has never smoked. She does not have any smokeless tobacco history on file. She reports that she drinks alcohol. She reports that she does not use illicit drugs.  Allergies:  Allergies  Allergen Reactions  . Penicillins   . Vicodin [Hydrocodone-Acetaminophen]     GI upset    Medications: I have reviewed the patient's current medications.  Results for orders placed during the hospital encounter of 10/15/13 (from the past 48 hour(s))  CK     Status: None   Collection Time    10/15/13  3:30 PM      Result Value Range   Total CK 16  7 - 177 U/L  BASIC METABOLIC PANEL     Status: Abnormal   Collection Time    10/16/13  6:04 AM      Result Value Range   Sodium 139  135 - 145 mEq/L   Potassium 4.8  3.5 - 5.1 mEq/L   Comment: DELTA CHECK NOTED     NO VISIBLE HEMOLYSIS   Chloride 109  96 - 112 mEq/L   CO2 22  19 - 32 mEq/L   Glucose, Bld 97  70 - 99 mg/dL   BUN 4 (*) 6 - 23 mg/dL   Creatinine, Ser 1.61  0.50 -  1.10 mg/dL   Calcium 7.3 (*) 8.4 - 10.5 mg/dL   GFR calc non Af Amer >90  >90 mL/min   GFR calc Af Amer >90  >90 mL/min   Comment: (NOTE)     The eGFR has been calculated using the CKD EPI equation.     This calculation has not been validated in all clinical situations.     eGFR's persistently <90 mL/min signify possible Chronic Kidney     Disease.  CBC     Status: Abnormal   Collection Time    10/16/13  6:04 AM      Result Value Range   WBC 4.9  4.0 - 10.5 K/uL   RBC 4.01  3.87 - 5.11 MIL/uL   Hemoglobin 13.8  12.0 - 15.0 g/dL   HCT 09.8  11.9 - 14.7 %   MCV 97.5  78.0 - 100.0 fL   MCH 34.4 (*) 26.0 - 34.0 pg   MCHC 35.3  30.0 - 36.0 g/dL   RDW 82.9  56.2 - 13.0 %   Platelets 346  150 - 400 K/uL  MAGNESIUM     Status: None   Collection Time    10/16/13  6:04 AM      Result Value Range   Magnesium 2.1  1.5 - 2.5 mg/dL  URINALYSIS, ROUTINE W REFLEX MICROSCOPIC     Status: None   Collection Time    10/16/13  6:43 PM       Result Value Range   Color, Urine YELLOW  YELLOW   APPearance CLEAR  CLEAR   Specific Gravity, Urine 1.016  1.005 - 1.030   pH 6.0  5.0 - 8.0   Glucose, UA NEGATIVE  NEGATIVE mg/dL   Hgb urine dipstick NEGATIVE  NEGATIVE   Bilirubin Urine NEGATIVE  NEGATIVE   Ketones, ur NEGATIVE  NEGATIVE mg/dL   Protein, ur NEGATIVE  NEGATIVE mg/dL   Urobilinogen, UA 0.2  0.0 - 1.0 mg/dL   Nitrite NEGATIVE  NEGATIVE   Leukocytes, UA NEGATIVE  NEGATIVE   Comment: MICROSCOPIC NOT DONE ON URINES WITH NEGATIVE PROTEIN, BLOOD, LEUKOCYTES, NITRITE, OR GLUCOSE <1000 mg/dL.  BASIC METABOLIC PANEL     Status: Abnormal   Collection Time    10/17/13  6:18 AM      Result Value Range   Sodium 137  135 - 145 mEq/L   Potassium 4.0  3.5 - 5.1 mEq/L   Chloride 105  96 - 112 mEq/L   CO2 23  19 - 32 mEq/L   Glucose, Bld 92  70 - 99 mg/dL   BUN 3 (*) 6 - 23 mg/dL   Creatinine, Ser 8.65 (*) 0.50 - 1.10 mg/dL   Calcium 7.8 (*) 8.4 - 10.5 mg/dL   GFR calc non Af Amer >90  >90 mL/min   GFR calc Af Amer >90  >90 mL/min   Comment: (NOTE)     The eGFR has been calculated using the CKD EPI equation.     This calculation has not been validated in all clinical situations.     eGFR's persistently <90 mL/min signify possible Chronic Kidney     Disease.    No results found.  Positive for anxiety, depression, separation anxiety and sleep disturbance Blood pressure 137/98, pulse 82, temperature 98 F (36.7 C), temperature source Oral, resp. rate 18, height 5' (1.524 m), weight 46.3 kg (102 lb 1.2 oz), SpO2 99.00%.   Assessment/Plan: Conversion disorder NOS  Adjustment disorder with depressed mood  Recommendation:  Start Remeron 7.5 mg PO Qhs Patient does not have safety issues Refer to out patient care at Dr. Evelene Croon at [facility] She does not meet in patient criteria Appreciate psych consultation on this patient.   Cleopha Indelicato,JANARDHAHA R. 10/17/2013, 12:55 PM

## 2013-10-17 NOTE — Progress Notes (Signed)
OT Cancellation Note  Patient Details Name: DAWNETTA COPENHAVER MRN: [id number] DOB: 1951-03-27   Cancelled Treatment:    Reason Eval/Treat Not Completed: Pain limiting ability to participate.  Will attempt again this PM.  Will Heinkel 10/17/2013, 12:09 PM

## 2013-10-17 NOTE — Progress Notes (Signed)
PT Cancellation Note  Patient Details Name: Adriana Figueroa MRN: [id number] DOB: [birth year]   Cancelled Treatment:     Pt refused to attempt to get out of bed secondary to severe pain along back of thighs and into lower legs. Agreeable to try once pain is under control. Will check again later today if time allows.    Sherrine Maples Cheek 10/17/2013, 11:52 AM

## 2013-10-17 NOTE — Progress Notes (Signed)
Pt had NT call RN to room and requested that her IV fluids be stopped. Patient stated that she was tired of getting up to use the [facility]. RN spoke with her about the importance of hydration, but patient persisted that she would rather drink water than have IV fluids running. IV saline locked per patient's request. Carnella Guadalajara, RN

## 2013-10-17 NOTE — Progress Notes (Signed)
Clinical Social Work Department BRIEF PSYCHOSOCIAL ASSESSMENT 10/17/2013  Patient:  Adriana Figueroa, Adriana Figueroa     Account Number:  [id number]     Admit date:  10/15/2013  Clinical Social Worker:  Leron Croak, CLINICAL SOCIAL WORKER  Date/Time:  10/17/2013 11:56 AM  Referred by:  Physician  Date Referred:  10/17/2013 Referred for  SNF Placement   Other Referral:   Interview type:  Patient Other interview type:    PSYCHOSOCIAL DATA Living Status:  ALONE Admitted from facility:   Level of care:   Primary support name:  Jobie Quaker  [phone] Primary support relationship to patient:  S[place] Degree of support available:   Pt has limited support system    CURRENT CONCERNS Current Concerns  Post-Acute Placement   Other Concerns:    SOCIAL WORK ASSESSMENT / PLAN CSW met with the Pt at the bedside. Pt is A7Ox4. CSW introduced self and reason for assessment. Pt was not aware of referral, however was open to assessment and will consider placement. Pt stated that she has never been to a rehab facility and that she has concerns that she may not be able to pay for the stay. CSW verified insurance ans Pt stated that even though she has insurance, she is "afraid that [employer] will not pay for all of the service." CSW did confirm that sometimes there is a copay and that it would depend on the services offered by that insurance. Pt is still agreeable to search in the local [place] area and would like CSW to speak with chosen facility prior to d/c to determine cost.    CSW will proceed to search in [facility] and will provide offers to Pt for d/c planning if PT recommends SNF.   Assessment/plan status:  Information/Referral to [facility] Other assessment/ plan:   Information/referral to community resources:   CSW provided listing of SNF's in [facility]    PATIENT'S/FAMILY'S RESPONSE TO PLAN OF CARE: Pt is appreciative for assistance and d/c planning.    Leron Croak LCSWA   [facility]

## 2013-10-17 NOTE — Progress Notes (Signed)
INITIAL NUTRITION ASSESSMENT  DOCUMENTATION CODES Per approved criteria  -Not Applicable   INTERVENTION: Add Resource Breeze po BID, each supplement provides 250 kcal and 9 grams of protein. Change diet to Vegetarian, per patient's preferences. RD to continue to follow nutrition care plan.  NUTRITION DIAGNOSIS: Inadequate oral intake related to poor appetite as evidenced by pt report.   Goal: Intake to meet >90% of estimated nutrition needs.  Monitor:  weight trends, lab trends, I/O's, PO intake, supplement tolerance  Reason for Assessment: Malnutrition Screening Tool  62 y.o. female  Admitting Dx: Hypokalemia  ASSESSMENT: PMHx significant for GERD and osteoporosis. Admitted with inability to walk, increased weakness and falls. Work-up reveals UTI and hypokalemia.  Per chart review, pt is depressed 2/2 recent divorce. Noted that patient reports her symptoms came on suddenly in May 2011 when the patient's husband suddenly left her after 25 years of marriage. Neuro evaluated pt and is recommending a pysch consult is considered.  Currently ordered for a Regular diet. She states that she is a Vegetarian - RD to change diet order accordingly. All she ate for breakfast this morning was half of a pancake, she "didn't feel like peeling her eggs." She notes that she usually doesn't have a great appetite, she ate better when she was married. States that she was 125 lb 4 years ago when she was married. Is able to eat something every day, but intake is minimal. Has tried Boost and Ensure in the past however she doesn't care for it because she vomited one time after drinking it and is afraid to drink it again. Agreeable to drinking [employer].  Height: Ht Readings from Last 1 Encounters:  10/15/13 5' (1.524 m)    Weight: Wt Readings from Last 1 Encounters:  10/15/13 102 lb 1.2 oz (46.3 kg)    Ideal Body Weight: 100 lb  % Ideal Body Weight: 102%  Wt Readings from Last 10  Encounters:  10/15/13 102 lb 1.2 oz (46.3 kg)  04/24/12 110 lb (49.896 kg)  05/08/11 120 lb (54.432 kg)  10/09/10 109 lb (49.442 kg)  07/23/10 115 lb (52.164 kg)  05/08/10 110 lb (49.896 kg)  11/19/09 124 lb (56.246 kg)    Usual Body Weight: 110 - 115 lb  % Usual Body Weight: 91%  BMI:  Body mass index is 19.93 kg/(m^2). WNL  Estimated Nutritional Needs: Kcal: 1400 - 1600 Protein: 50 - 60 g Fluid: 1.4 - 1.6 liters  Skin: intact  Diet Order: General  EDUCATION NEEDS: -No education needs identified at this time   Intake/Output Summary (Last 24 hours) at 10/17/13 0842 Last data filed at 10/17/13 0300  Gross per 24 hour  Intake    920 ml  Output      0 ml  Net    920 ml    Last BM: PTA  Labs:   Recent Labs Lab 10/15/13 0901 10/16/13 0604 10/17/13 0618  NA 137 139 137  K 2.8* 4.8 4.0  CL 95* 109 105  CO2 27 22 23   BUN 9 4* 3*  CREATININE 0.55 0.50 0.49*  CALCIUM 9.0 7.3* 7.8*  MG 1.7 2.1  --   GLUCOSE 105* 97 92    CBG (last 3)  No results found for this basename: GLUCAP,  in the last 72 hours  Scheduled Meds: . cefTRIAXone (ROCEPHIN)  IV  1 g Intravenous Q24H  . heparin  5,000 Units Subcutaneous Q8H  . influenza vac split quadrivalent PF  0.5 mL Intramuscular  Tomorrow-1000  . pantoprazole  40 mg Oral Daily  . sodium chloride  3 mL Intravenous Q12H    Continuous Infusions: . sodium chloride 100 mL/hr at 10/16/13 2228    Past Medical History  Diagnosis Date  . GERD (gastroesophageal reflux disease)     on nexium since 1989 ? had esoph ulceration reported egd not in record  . History of esophageal ulcer   . Hyperlipidemia   . Osteoporosis     by dexa in the past failed orals has GERD last dexa 4/11 -1.4, -1.5 relast Jan 2010  . Fracture     hx in the past  . 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 Primiparous   . DJD (degenerative joint disease)     MRI spine DJD 2011  ack surgery consult.  . Depression     [facility]  . Sleep disorder     decreased  . B12 deficiency     gives her own shots  . Fibromyalgia     History reviewed. No pertinent past surgical history.  Jarold Motto MS, RD, LDN Pager: [phone] After-hours pager: [phone]

## 2013-10-17 NOTE — Progress Notes (Signed)
Physical Therapy Treatment Patient Details Name: Adriana Figueroa MRN: [id number] DOB: 1951-05-27 Today's Date: 10/17/2013 Time: 1345-1415 PT Time Calculation (min): 30 min  PT Assessment / Plan / Recommendation  History of Present Illness 62 y.o. female admitted to [facility] with with past medical history of GERD and osteoporosis came into the hospital because of inability to walk.  She was found to be hypokalemic and urine was + for UTI.  MRI of lumbar spine with no significant changes from MRI in 2011.     PT Comments   Pt much more agreeable to therapy this afternoon, reports morphine has given her good pain relief (down to 8/10) and she is now able to tolerate mobility. Pt able to ambulate short distance gait with +2 for safety, mod assist overall. Poor coordination and motor planning with involvement in of UEs, trunk and LEs. Cognition impaired but difficult to define. Memory and attention involved. Pt also had difficulty evacuating bladder during session, RN made aware. HR up to 140 during ambulation, 120s with bed mobility. Will continue to follow acutely.    Follow Up Recommendations  SNF;CIR     Does the patient have the potential to tolerate intense rehabilitation   Potentially  Barriers to Discharge  Limited caregiver support at D/C      Equipment Recommendations  Rolling walker with 5" wheels;Wheelchair (measurements PT);Wheelchair cushion (measurements PT);3in1 (PT)    Recommendations for Other Services Rehab consult (screen)  Frequency Min 3X/week   Progress towards PT Goals Progress towards PT goals: Progressing toward goals  Plan Discharge plan needs to be updated    Precautions / Restrictions Precautions Precautions: Fall Precaution Comments: bil leg weakness and pain.     Pertinent Vitals/Pain 8/10 "tolerable" pain in bil. LEs    Mobility  Bed Mobility Bed Mobility: Supine to Sit;Sitting - Scoot to Edge of Bed Supine to Sit: 4: Min assist;With rails;HOB  elevated Sitting - Scoot to Edge of Bed: 4: Min guard;With rail Details for Bed Mobility Assistance: min assist to support trunk to get to EOB, pt able to slide legs over without assist.   Transfers Transfers: Sit to Stand;Stand to Sit Sit to Stand: 3: Mod assist;With upper extremity assist;With armrests;From bed;From chair/3-in-1 (+2 for safety and pt comfort) Stand to Sit: 3: Mod assist;With upper extremity assist;With armrests;To bed;To chair/3-in-1 Details for Transfer Assistance: mod assist to obtain full standing secondary to weak legs, repeated cues needed for UE placement.  Pt is anxious and fearful of falling.  "You're holding onto me right?!"   Ambulation/Gait Ambulation/Gait Assistance: 3: Mod assist;2: Max assist (+2 for safety) Ambulation Distance (Feet): 4 Feet (and 9') Assistive device: Rolling walker Ambulation/Gait Assistance Details: Very wide base of support, poor placement of bil. feet. Max assist for RW management, negotiation in room and for safe proximity. "This is big progress for me" Gait Pattern: Trunk flexed;Wide base of support;Step-to pattern;Shuffle (bil. LE External rotation, pt wearing gown difficult to assess knees) Stairs: No     PT Goals (current goals can now be found in the care plan section) Acute Rehab PT Goals Patient Stated Goal: to decrease the pain in her legs  Visit Information  Last PT Received On: 10/17/13 Assistance Needed: +2 PT/OT Co-Evaluation/Treatment: Yes Reason Eval/Treat Not Completed: Pain limiting ability to participate History of Present Illness: 62 y.o. female admitted to [facility] with with past medical history of GERD and osteoporosis came into the hospital because of inability to walk.  She was found to be hypokalemic  and urine was + for UTI.  MRI of lumbar spine with no significant changes from MRI in 2011.      Subjective Data  Patient Stated Goal: to decrease the pain in her legs   Cognition  Cognition Arousal/Alertness:  Awake/alert Overall Cognitive Status: Within Functional Limits for tasks assessed (impaired attention) Memory: Decreased short-term memory (decreased long term memory)    Balance  Balance Balance Assessed: Yes Static Sitting Balance Static Sitting - Balance Support: Bilateral upper extremity supported;Feet supported Static Sitting - Level of Assistance: 5: Stand by assistance Static Standing Balance Static Standing - Balance Support: Bilateral upper extremity supported Static Standing - Level of Assistance: 3: Mod assist Dynamic Standing Balance Dynamic Standing - Balance Support: Bilateral upper extremity supported;No upper extremity supported Dynamic Standing - Level of Assistance: 4: Min assist;3: Mod assist Dynamic Standing - Balance Activities: Forward lean/weight shifting;Reaching for objects;Reaching across midline Dynamic Standing - Comments: Pt anxious and resistent to let go of RW at times.   End of Session PT - End of Session Equipment Utilized During Treatment: Gait belt Activity Tolerance: Patient limited by pain;Patient limited by fatigue Patient left: in bed;with call bell/phone within reach;Other (comment) (finishing up with OT) Nurse Communication: Mobility status (urinary difficulties?)   GP     Wilhemina Bonito 10/17/2013, 2:45 PM

## 2013-10-17 NOTE — Progress Notes (Signed)
[facility] PROGRESS NOTE  Adriana Figueroa BMW:[id number] DOB: January 05, 1951 DOA: 10/15/2013 PCP: Lorretta Harp, MD  HPI/Subjective: Pt says that she is having a lot of pain in her legs and is refusing to try to walk.  Assessment/Plan: Principal Problem:  Hypokalemia  Active Problems:  Depression  Weight loss  Leg weakness, bilateral  Prolonged QT interval  UTI (urinary tract infection)  Hypokalemia  -Unclear etiology, patient is not on diuretics, denies any recent diarrhea or vomiting.  -Potassium is corrected by oral supplementation.  -Repeat BMP this am (11/3) shows K at 4.0   UTI  -Urinalysis consistent with UTI, started empirically on Rocephin.  -Urine culture shows 60k colonies of multiple morphotypes -UA yesterday was clean -No significant growth I will discontinue antibiotics.  Lower extremities weakness  -Unclear etiology, thought to be initially secondary to hypokalemia. MRI of the lumbar spine without acute findings. -Has normal total CPK and TSH, she is not on statin.  -After the hypokalemia corrected, patient complains change from weakness to pain - PT/OT evaluated pt, pt was unwilling to work with PT; PT recommends min 3x/wk at [facility]. -Neurology evaluated the patient, suggested this is might be functional and recommended psych consultation. -Psych consulted today    Prolonged QT interval  -Likely secondary to hypokalemia, EKG showed QT of 447 and QTc of 512 ms.  -Repeat EKG showed improvement.   Depression -Pt ruminating on divorce and lack of support -Psych to be consulted   Code Status: FULL Family Communication: none Disposition Plan: Inpatient, social worker to see regarding disposition.   Consultants:  PT  Psych  Neuro  Procedures:  none  Antibiotics:  Ceftriaxone 1 g started 11/2   Objective: Filed Vitals:   10/17/13 0815  BP: 137/98  Pulse: 82  Temp: 98 F (36.7 C)  Resp: 18    Intake/Output Summary (Last 24 hours)  at 10/17/13 0954 Last data filed at 10/17/13 0540  Gross per 24 hour  Intake    920 ml  Output    300 ml  Net    620 ml   Filed Weights   10/15/13 0828 10/15/13 1234  Weight: 45.36 kg (100 lb) 46.3 kg (102 lb 1.2 oz)    Exam:   General: wdwn woman in nad lying in bed  Cardiovascular: RRR, no m/g/r  Respiratory: clear to auscultation bilat, no inc wob  Abdomen: +BS, soft, nontender, nondistended  Musculoskeletal: all 4 extremities with normal ROM, no edema; pt subjectively reports pain  Data Reviewed: Basic Metabolic Panel:  Recent Labs Lab 10/15/13 0901 10/16/13 0604 10/17/13 0618  NA 137 139 137  K 2.8* 4.8 4.0  CL 95* 109 105  CO2 27 22 23   GLUCOSE 105* 97 92  BUN 9 4* 3*  CREATININE 0.55 0.50 0.49*  CALCIUM 9.0 7.3* 7.8*  MG 1.7 2.1  --    Liver Function Tests:  Recent Labs Lab 10/15/13 0901  AST 32  ALT 20  ALKPHOS 91  BILITOT 0.9  PROT 7.0  ALBUMIN 3.1*   CBC:  Recent Labs Lab 10/15/13 0901 10/16/13 0604  WBC 6.3 4.9  NEUTROABS 4.1  --   HGB 15.6* 13.8  HCT 43.6 39.1  MCV 96.7 97.5  PLT 390 346   Cardiac Enzymes:  Recent Labs Lab 10/15/13 1114 10/15/13 1530  CKTOTAL 12 16  CKMB 0.8  --      Recent Results (from the past 240 hour(s))  URINE CULTURE     Status: None  Collection Time    10/15/13  9:32 AM      Result Value Range Status   Specimen Description URINE, RANDOM   Final   Special Requests NONE   Final   Culture  Setup Time     Final   Value: 10/15/2013 18:12     Performed at [facility] Count     Final   Value: 60,000 COLONIES/ML     Performed at [facility]   Culture     Final   Value: Multiple bacterial morphotypes present, none predominant. Suggest appropriate recollection if clinically indicated.     Performed at [facility]   Report Status 10/16/2013 FINAL   Final     Studies: Mr Lumbar Spine Wo Contrast  10/15/2013   CLINICAL DATA:  Low back pain with left leg  weakness and difficulty walking for 1 month. No previous relevant surgery.  EXAM: MRI LUMBAR SPINE WITHOUT CONTRAST  TECHNIQUE: Multiplanar, multisequence MR imaging was performed. No intravenous contrast was administered.  COMPARISON:  Lumbar MRI 02/10/2010.  FINDINGS: Five lumbar type vertebral bodies are assumed. There is a mild convex right scoliosis. A hemangioma is noted within the right aspect of the L3 vertebral body. There are no suspicious marrow lesions. There is no evidence of acute fracture or pars defect.  The conus medullaris extends to the L1-2 level and appears normal. No paraspinal abnormalities are identified. Moderate distention of the urinary bladder is noted.  There are no significant disc space findings from T11-12 through L1-2.  L2-3: Stable broad-based right lateral disc protrusion. No L2 nerve root encroachment is demonstrated currently. There is mild facet and ligamentous hypertrophy.  L3-4: Stable disc bulging and left foraminal/extraforaminal disc protrusion. This contacts the left L3 nerve root laterally within the foramen and mildly narrows the left lateral recess. There is no right-sided nerve root encroachment.  L4-5: Stable broad-based left foraminal disc protrusion with mild resulting left lateral recess and left foraminal stenosis. There is mild facet and ligamentous hypertrophy.  L5-S1: Stable mild disc bulging, facet and ligament hypertrophy. No significant spinal stenosis or nerve root encroachment.  IMPRESSION: No acute findings or significant changes are demonstrated compared with the prior study from 2011. There are broad-based foraminal and extraforaminal disc protrusions on the left at L3-4 and L4-5 contributing to only mild foraminal and lateral recess stenosis as described.   Electronically Signed   By: Roxy Horseman M.D.   On: 10/15/2013 10:43    Scheduled Meds: . cefTRIAXone (ROCEPHIN)  IV  1 g Intravenous Q24H  . heparin  5,000 Units Subcutaneous Q8H  . influenza  vac split quadrivalent PF  0.5 mL Intramuscular Tomorrow-1000  . pantoprazole  40 mg Oral Daily  . sodium chloride  3 mL Intravenous Q12H   Continuous Infusions: . sodium chloride 100 mL/hr at 10/16/13 2228    Principal Problem:   Hypokalemia Active Problems:   Depression   Weight loss   Leg weakness, bilateral   Prolonged QT interval   UTI (urinary tract infection)   Weakness of both lower limbs        Normand Sloop  [facility] Pager [phone] If 7PM-7AM, please contact night-coverage at [email or web address], password [facility] 10/17/2013, 9:54 AM  LOS: 2 days     Addendum  Patient seen and examined, chart and data base reviewed.  I agree with the above assessment and plan.  For full details please see Mrs. Normand Sloop note.  I reviewed and  addended the above note   Clint Lipps, MD [facility] Pager: [phone] 10/17/2013, 10:22 AM

## 2013-10-17 NOTE — Progress Notes (Signed)
Clinical Social Work Department CLINICAL SOCIAL WORK PLACEMENT NOTE 10/17/2013  Patient:  JAHNIA, HEWES  Account Number:  [id number] Admit date:  10/15/2013  Clinical Social Worker:  Leron Croak, CLINICAL SOCIAL WORKER  Date/time:  10/17/2013 01:20 PM  Clinical Social Work is seeking post-discharge placement for this patient at the following level of care:   SKILLED NURSING   (*CSW will update this form in Epic as items are completed)   10/17/2013  Patient/family provided with [facility] Department of Clinical Social Work's list of facilities offering this level of care within the geographic area requested by the patient (or if unable, by the patient's family).  10/17/2013  Patient/family informed of their freedom to choose among providers that offer the needed level of care, that participate in Medicare, Medicaid or managed care program needed by the patient, have an available bed and are willing to accept the patient.  10/17/2013  Patient/family informed of [facility]' ownership interest in [facility], as well as of the fact that they are under no obligation to receive care at this facility.  PASARR submitted to EDS on 10/17/2013 PASARR number received from [employer] on 10/17/2013  FL2 transmitted to all facilities in geographic area requested by pt/family on  10/17/2013 FL2 transmitted to all facilities within larger geographic area on 10/17/2013  Patient informed that his/her managed care company has contracts with or will negotiate with  certain facilities, including the following:     Patient/family informed of bed offers received:   Patient chooses bed at  Physician recommends and patient chooses bed at    Patient to be transferred to  on   Patient to be transferred to facility by   The following physician request were entered in Epic:   Additional Comments:   CSW will continue to follow Pt for d/c planning.   Leron Croak  MSW, LCSWA  [facility]

## 2013-10-18 MED ORDER — TRAMADOL HCL 50 MG PO TABS: 25.0000 mg | ORAL_TABLET | Freq: Four times a day (QID) | ORAL | Status: DC | PRN

## 2013-10-18 MED ORDER — IBUPROFEN 400 MG PO TABS: 400.0000 mg | ORAL_TABLET | Freq: Four times a day (QID) | ORAL | Status: DC | PRN

## 2013-10-18 MED ORDER — MIRTAZAPINE 7.5 MG PO TABS: 7.5000 mg | ORAL_TABLET | Freq: Every day | ORAL | Status: DC

## 2013-10-18 NOTE — Discharge Summary (Addendum)
Physician Discharge Summary  Adriana Figueroa ZOX:[id number] DOB: Jul 24, 1951 DOA: 10/15/2013  PCP: Lorretta Harp, MD  Admit date: 10/15/2013 Discharge date: 10/20/2013    Time spent: 40 minutes   Recommendations for Outpatient Follow-up:  1. Followup with primary care physician within one week. 2. Followup with psychiatry as outpatient. 3. Followup with recommended neurosurgeon, neurologist and urologist as below. 4. Followup on pending B12 levels   Discharge Diagnoses:    Principal Problem:   Hypokalemia Active Problems:   Depression   Weight loss   Leg weakness, bilateral   Prolonged QT interval   UTI (urinary tract infection)   Weakness of both lower limbs   Discharge Condition: Stable  Diet recommendation: Heart Healthy   Discharge Instructions  Follow with Primary MD Lorretta Harp, MD in 7 days   Get CBC, CMP, B12, checked 7 days by Primary MD and again as instructed by your Primary MD.    Get Medicines reviewed and adjusted.  Please request your Prim.MD to go over all Hospital Tests and Procedure/Radiological results at the follow up, please get all Hospital records sent to your Prim MD by signing hospital release before you go home.  Activity: As tolerated with Full fall precautions use walker/cane & assistance as needed   Diet:  Heart Healthy  For Heart failure patients - Check your Weight same time everyday, if you gain over 2 pounds, or you develop in leg swelling, experience more shortness of breath or chest pain, call your Primary MD immediately. Follow Cardiac Low Salt Diet and 1.8 lit/day fluid restriction.  Disposition SNF  If you experience worsening of your admission symptoms, develop shortness of breath, life threatening emergency, suicidal or homicidal thoughts you must seek medical attention immediately by calling 911 or calling your MD immediately  if symptoms less severe.  You Must read complete instructions/literature along with  all the possible adverse reactions/side effects for all the Medicines you take and that have been prescribed to you. Take any new Medicines after you have completely understood and accpet all the possible adverse reactions/side effects.   Do not drive and provide baby sitting services if your were admitted for syncope or siezures until you have seen by Primary MD or a Neurologist and advised to do so again.  Do not drive when taking Pain medications.    Do not take more than prescribed Pain, Sleep and Anxiety Medications  Special Instructions: If you have smoked or chewed Tobacco  in the last 2 yrs please stop smoking, stop any regular Alcohol  and or any Recreational drug use.  Wear Seat belts while driving.   Please note  You were cared for by a hospitalist during your hospital stay. If you have any questions about your discharge medications or the care you received while you were in the hospital after you are discharged, you can call the unit and asked to speak with the hospitalist on call if the hospitalist that took care of you is not available. Once you are discharged, your primary care physician will handle any further medical issues. Please note that NO REFILLS for any discharge medications will be authorized once you are discharged, as it is imperative that you return to your primary care physician (or establish a relationship with a primary care physician if you do not have one) for your aftercare needs so that they can reassess your need for medications and monitor your lab values.    Follow-up Information   Follow up with Lorretta Harp,  MD In 1 week.   Specialty:  Internal Medicine   Contact information:   [address] [place] [address] [phone]       Follow up with Glori Bickers, MD In 2 weeks.   Specialty:  Psychiatry   Contact information:   [address] P[address]Tyson Babinski [place] [address] [phone]       Follow up  with Antony Haste, MD. Schedule an appointment as soon as possible for a visit in 1 week. (Urinary retention)    Specialty:  Urology   Contact information:   [address] [place] [address] [phone]       Follow up with [facility] P, MD. Schedule an appointment as soon as possible for a visit in 3 days.   Specialty:  Neurosurgery   Contact information:   [address] [place] [address] [phone]       Follow up with [facility]. Schedule an appointment as soon as possible for a visit in 3 days.   Contact information:   [address] [place] [address] [phone]        Filed Weights   10/15/13 0630 10/15/13 1234  Weight: 45.36 kg (100 lb) 46.3 kg (102 lb 1.2 oz)       History of present illness:     Adriana Figueroa is a 62 y.o. female with past medical history of GERD and osteoporosis came into the hospital because of inability to walk. Patient said for the past 3-4 weeks she's been weak, unable to walk around, this was progressively getting worse since started. Yesterday she got to the point she fell about 5 times without hitting her head. She came in today for further evaluation.  In the ED initial evaluation showed potassium of 2.8, EKG showed prolonged QTC of 512, patient is so weak to the point she couldn't stand up and her urinalysis was consistent with UTI. She developed some urinary retention on 10/18/2013 after which a Foley catheter has been placed.    Hospital Course:     1. Hypokalemia: Patient presented to the hospital with potassium of 2.8, unclear etiology. Patient was not on diuretics. She denies any recent vomiting or diarrhea. Potassium corrected with oral supplements. On discharge potassium is 4.0.      2. Lower extremity weakness: Very unclear to me, initially this was thought to be secondary to hypokalemia. But after hypokalemia corrected patient was still  complaining about weakness. MRI of lumbar spine done and showed no acute findings. Patient does have normal total CPK and TSH. She's not on any statin medication. Patient complains changed from weakness to pain, neurology was consulted and suggested this is might be functional and recommended psych consultation. Psych recommended Remeron at nighttime, patient does have depression.  She also developed urinary retention on 10/18/2013, she now has a Foley catheter and has been placed on Flomax, I have discussed her case in detail with neurosurgeon Dr. Wynetta Emery who suggested that her MRI is not suggestive of any acute cortical pressure and and that she can follow with him in the office in one week after supportive care. He did recommend that a short course of steroid taper was no harm and can be tried.  I have ordered a B12 level which is pending, please monitor B12 levels, B12 level in our system one year ago was borderline low.     3. UTI: Urinalysis was  consistent with UTI the time of admission, patient started empirically on Rocephin. The culture showed insignificant growth was 60K colonies of multiple morphotypes. Rocephin discontinued.     4. Prolonged QT interval: The time of admission her QTC was 512, this was thought to be secondary to hypokalemia. After the hypokalemia corrected 12-lead EKG repeated showed QTC is shorter 473.     5. Depression: Reactive depression, with recent stressor. Patient went through a prolonged divorce finalized in February of 2013. Patient since then having problems with taking care of herself, seen by psychiatry and started on Remeron 7.5. Psychiatry recommended to followup with her psychiatrist Dr. Evelene Croon as outpatient.      Procedures:  None     Consultations:  Psych.  Neurology     Discharge Exam: Filed Vitals:   10/20/13 0528  BP: 132/88  Pulse:   Temp:   Resp:      General: Alert and awake, oriented x3, not in any acute  distress. HEENT: anicteric sclera, pupils reactive to light and accommodation, EOMI CVS: S1-S2 clear, no murmur rubs or gallops Chest: clear to auscultation bilaterally, no wheezing, rales or rhonchi Abdomen: soft nontender, nondistended, normal bowel sounds, no organomegaly Extremities: no cyanosis, clubbing or edema noted bilaterally Neuro: Cranial nerves II-XII intact, no focal neurological deficits     Discharge Instructions       Discharge Orders   Future Orders Complete By Expires   Increase activity slowly  As directed        Medication List         ibuprofen 400 MG tablet  Commonly known as:  ADVIL,MOTRIN  Take 1 tablet (400 mg total) by mouth every 6 (six) hours as needed for mild pain or moderate pain.     methylPREDNISolone 4 MG tablet  Commonly known as:  MEDROL DOSEPAK  follow package directions     mirtazapine 7.5 MG tablet  Commonly known as:  REMERON  Take 1 tablet (7.5 mg total) by mouth at bedtime.     tamsulosin 0.4 MG Caps capsule  Commonly known as:  FLOMAX  Take 1 capsule (0.4 mg total) by mouth daily after breakfast.     traMADol 50 MG tablet  Commonly known as:  ULTRAM  Take 0.5 tablets (25 mg total) by mouth every 6 (six) hours as needed.         Allergies  Allergen Reactions  . Penicillins   . Vicodin [Hydrocodone-Acetaminophen]     GI upset   Follow-up Information   Follow up with Lorretta Harp, MD In 1 week.   Specialty:  Internal Medicine   Contact information:   [address] [place] [address] [phone]       Follow up with Glori Bickers, MD In 2 weeks.   Specialty:  Psychiatry   Contact information:   [address] [place] [address] [phone]       Follow up with Antony Haste, MD. Schedule an appointment as soon as possible for a visit in 1 week. (Urinary retention)    Specialty:  Urology   Contact information:   [address]  [place] [address] [phone]       Follow up with [facility] P, MD. Schedule an appointment as soon as possible for a visit in 3 days.   Specialty:  Neurosurgery   Contact information:   [address] [place] [address] [phone]  Follow up with [facility]. Schedule an appointment as soon as possible for a visit in 3 days.   Contact information:   [address] [place] [address] [phone]       The results of significant diagnostics from this hospitalization (including imaging, microbiology, ancillary and laboratory) are listed below for reference.      Significant Diagnostic Studies:   Mr Lumbar Spine Wo Contrast  10/15/2013   CLINICAL DATA:  Low back pain with left leg weakness and difficulty walking for 1 month. No previous relevant surgery.  EXAM: MRI LUMBAR SPINE WITHOUT CONTRAST  TECHNIQUE: Multiplanar, multisequence MR imaging was performed. No intravenous contrast was administered.  COMPARISON:  Lumbar MRI 02/10/2010.  FINDINGS: Five lumbar type vertebral bodies are assumed. There is a mild convex right scoliosis. A hemangioma is noted within the right aspect of the L3 vertebral body. There are no suspicious marrow lesions. There is no evidence of acute fracture or pars defect.  The conus medullaris extends to the L1-2 level and appears normal. No paraspinal abnormalities are identified. Moderate distention of the urinary bladder is noted.  There are no significant disc space findings from T11-12 through L1-2.  L2-3: Stable broad-based right lateral disc protrusion. No L2 nerve root encroachment is demonstrated currently. There is mild facet and ligamentous hypertrophy.  L3-4: Stable disc bulging and left foraminal/extraforaminal disc protrusion. This contacts the left L3 nerve root laterally within the foramen and mildly narrows the left lateral recess. There is no right-sided nerve root encroachment.   L4-5: Stable broad-based left foraminal disc protrusion with mild resulting left lateral recess and left foraminal stenosis. There is mild facet and ligamentous hypertrophy.  L5-S1: Stable mild disc bulging, facet and ligament hypertrophy. No significant spinal stenosis or nerve root encroachment.  IMPRESSION: No acute findings or significant changes are demonstrated compared with the prior study from 2011. There are broad-based foraminal and extraforaminal disc protrusions on the left at L3-4 and L4-5 contributing to only mild foraminal and lateral recess stenosis as described.   Electronically Signed   By: Roxy Horseman M.D.   On: 10/15/2013 10:43     Microbiology:   Recent Results (from the past 240 hour(s))  URINE CULTURE     Status: None   Collection Time    10/15/13  9:32 AM      Result Value Range Status   Specimen Description URINE, RANDOM   Final   Special Requests NONE   Final   Culture  Setup Time     Final   Value: 10/15/2013 18:12     Performed at [facility] Count     Final   Value: 60,000 COLONIES/ML     Performed at [facility]   Culture     Final   Value: Multiple bacterial morphotypes present, none predominant. Suggest appropriate recollection if clinically indicated.     Performed at [facility]   Report Status 10/16/2013 FINAL   Final       Labs:   Basic Metabolic Panel:  Recent Labs Lab 10/15/13 0901 10/16/13 0604 10/17/13 0618  NA 137 139 137  K 2.8* 4.8 4.0  CL 95* 109 105  CO2 27 22 23   GLUCOSE 105* 97 92  BUN 9 4* 3*  CREATININE 0.55 0.50 0.49*  CALCIUM 9.0 7.3* 7.8*  MG 1.7 2.1  --    Liver Function Tests:  Recent Labs Lab 10/15/13 0901  AST 32  ALT 20  ALKPHOS 91  BILITOT 0.9  PROT 7.0  ALBUMIN 3.1*   No results found for this basename: LIPASE, AMYLASE,  in the last 168 hours No results found for this basename: AMMONIA,  in the last 168 hours CBC:  Recent Labs Lab 10/15/13 0901  10/16/13 0604  WBC 6.3 4.9  NEUTROABS 4.1  --   HGB 15.6* 13.8  HCT 43.6 39.1  MCV 96.7 97.5  PLT 390 346   Cardiac Enzymes:  Recent Labs Lab 10/15/13 1114 10/15/13 1530  CKTOTAL 12 16  CKMB 0.8  --    BNP:  BNP (last 3 results)   No results found for this basename: PROBNP,  in the last 8760 hours  CBG:   No results found for this basename: GLUCAP,  in the last 168 hours     Signed:  Cortina Vultaggio K  [facility] 10/20/2013, 11:15 AM

## 2013-10-18 NOTE — Progress Notes (Signed)
At 18:00 pt. became weak during transfer to [facility]. Pt. Lowered to ground safely. No bruising, no skin tears noted. No complaints of pain/discomfort noted at this time. Pt. able to move all extremities. Call bell is within reach. Will continue to monitor.

## 2013-10-19 MED ORDER — METHYLPREDNISOLONE 4 MG PO KIT: PACK | ORAL | Status: DC

## 2013-10-19 MED ORDER — HYDRALAZINE HCL 20 MG/ML IJ SOLN
5.0000 mg | Freq: Once | INTRAMUSCULAR | Status: AC
  Administered 2013-10-19: 5 mg via INTRAVENOUS
  Filled 2013-10-19: qty 0.25

## 2013-10-19 MED ORDER — TAMSULOSIN HCL 0.4 MG PO CAPS: 0.4000 mg | ORAL_CAPSULE | Freq: Every day | ORAL | Status: DC

## 2013-10-19 NOTE — Progress Notes (Signed)
BP 142/104 manual. MD notified.  Will continue to monitor.

## 2013-10-19 NOTE — Progress Notes (Signed)
Patient complain of needing to void but unable to urinate.  Bladder scan shows 950 cc.  Merdis Delay, NP notified.

## 2013-10-19 NOTE — Plan of Care (Signed)
Problem: Phase III Progression Outcomes Goal: Foley discontinued Outcome: Not Met (add Reason) Pt. Will be d/c'd to SNF for rehab with foley d/t urinary retention

## 2013-10-19 NOTE — Care Management Note (Addendum)
    Page 1 of 1   10/20/2013     11:41:43 AM   CARE MANAGEMENT NOTE 10/20/2013  Patient:  Adriana Figueroa, Adriana Figueroa   Account Number:  [id number]  Date Initiated:  10/19/2013  Documentation initiated by:  Letha Cape  Subjective/Objective Assessment:   dx hypokalemia  admit- lives alone.     Action/Plan:   pt/ot eval- rec snf.   Anticipated DC Date:  10/20/2013   Anticipated DC Plan:  SKILLED NURSING FACILITY  In-house referral  Clinical Social Worker      DC Planning Services  CM consult      Choice offered to / List presented to:             Status of service:  Completed, signed off Medicare Important Message given?   (If response is "NO", the following Medicare IM given date fields will be blank) Date Medicare IM given:   Date Additional Medicare IM given:    Discharge Disposition:  SKILLED NURSING FACILITY  Per UR Regulation:  Reviewed for med. necessity/level of care/duration of stay  If discussed at Long Length of Stay Meetings, dates discussed:    Comments:  10/20/13 11:40 Letha Cape RN, BSN [phone] patient for dc to snf today, auth received from insurance to facility.  10/19/13 10:29 Letha Cape RN, BSN [phone] patient for dc to snf today, CSW following.

## 2013-10-19 NOTE — Clinical Social Work Note (Signed)
CSW has contacted [facility] admission coordinator several times throughout the day to check on [employer] authorization status. As of 4:07 PM authorization has still not been obtained.   Roddie Mc, [place], [place], [phone]

## 2013-10-20 DIAGNOSIS — L659 Nonscarring hair loss, unspecified: Secondary | ICD-10-CM

## 2013-10-20 LAB — VITAMIN B12: Vitamin B-12: 292 pg/mL (ref 211–911)

## 2013-10-20 MED ORDER — METHYLPREDNISOLONE SODIUM SUCC 125 MG IJ SOLR
60.0000 mg | Freq: Once | INTRAMUSCULAR | Status: AC
  Administered 2013-10-20: 11:00:00 60 mg via INTRAVENOUS
  Filled 2013-10-20: qty 0.96

## 2013-10-20 NOTE — Plan of Care (Signed)
Problem: Phase II Progression Outcomes Goal: Progress activity as tolerated unless otherwise ordered Outcome: Not Met (add Reason) Pt. Unable going to [facility] for rehab

## 2013-10-20 NOTE — Plan of Care (Signed)
Problem: Phase I Progression Outcomes Goal: OOB as tolerated unless otherwise ordered Outcome: Not Met (add Reason) Pt. Unable to stand, going to SNF for rehab  Problem: Phase III Progression Outcomes Goal: Activity at appropriate level-compared to baseline (UP IN CHAIR FOR HEMODIALYSIS)  Outcome: Not Met (add Reason) Pt. Unable, going to SNF for rehab

## 2013-10-20 NOTE — Clinical Social Work Placement (Signed)
Clinical Social Work Department CLINICAL SOCIAL WORK PLACEMENT NOTE 10/20/2013  Patient:  Adriana Figueroa, Adriana Figueroa  Account Number:  [id number] Admit date:  10/15/2013  Clinical Social Worker:  Leron Croak, CLINICAL SOCIAL WORKER  Date/time:  10/17/2013 01:20 PM  Clinical Social Work is seeking post-discharge placement for this patient at the following level of care:   SKILLED NURSING   (*CSW will update this form in Epic as items are completed)   10/17/2013  Patient/family provided with [facility] Department of Clinical Social Work's list of facilities offering this level of care within the geographic area requested by the patient (or if unable, by the patient's family).  10/17/2013  Patient/family informed of their freedom to choose among providers that offer the needed level of care, that participate in Medicare, Medicaid or managed care program needed by the patient, have an available bed and are willing to accept the patient.  10/17/2013  Patient/family informed of [facility]' ownership interest in [facility], as well as of the fact that they are under no obligation to receive care at this facility.  PASARR submitted to EDS on 10/17/2013 PASARR number received from EDS on 10/17/2013  FL2 transmitted to all facilities in geographic area requested by pt/family on  10/17/2013 FL2 transmitted to all facilities within larger geographic area on 10/17/2013  Patient informed that his/her managed care company has contracts with or will negotiate with  certain facilities, including the following:     Patient/family informed of bed offers received:  10/18/2013 Patient chooses bed at [facility] Physician recommends and patient chooses bed at    Patient to be transferred to [facility] on  10/20/2013 Patient to be transferred to facility by Ambulance  The following physician request were entered in Epic:   Additional Comments: Per MD patient ready  for DC to [facility] 10/20/13. RN, patient, and facility notified of DC. Ambulance transport requested for patient. RN given number for report. DC packet left with patient's chart. CSW signing off.   Roddie Mc, [place], [place], [phone]

## 2013-10-20 NOTE — Progress Notes (Signed)
PT Cancellation Note  Patient Details Name: SAMARRAH TRANCHINA MRN: [id number] DOB: 02-May-1951   Cancelled Treatment:     Pt initially refused to get OOB "you people don't know what pain is", with education on importance of mobility pt agreed to sit on side of bed. However, pt proceeded to get insurance card out of purse and demand that she was calling insurance at this time and not doing PT first. Will attempt to see pt later today if time allows.    Sherrine Maples Cheek 10/20/2013, 11:25 AM

## 2013-10-20 NOTE — Progress Notes (Signed)
[facility] discharged Skilled nursing facility per MD order.  Report called to receiving [place] Car[place], LPN at [facility].     Medication List         ibuprofen 400 MG tablet  Commonly known as:  ADVIL,MOTRIN  Take 1 tablet (400 mg total) by mouth every 6 (six) hours as needed for mild pain or moderate pain.     methylPREDNISolone 4 MG tablet  Commonly known as:  MEDROL DOSEPAK  follow package directions     mirtazapine 7.5 MG tablet  Commonly known as:  REMERON  Take 1 tablet (7.5 mg total) by mouth at bedtime.     tamsulosin 0.4 MG Caps capsule  Commonly known as:  FLOMAX  Take 1 capsule (0.4 mg total) by mouth daily after breakfast.     traMADol 50 MG tablet  Commonly known as:  ULTRAM  Take 0.5 tablets (25 mg total) by mouth every 6 (six) hours as needed.        Patients skin is clean, dry and intact, no evidence of skin break down. IV site discontinued and catheter remains intact. Site without signs and symptoms of complications. Dressing and pressure applied.  Patient transported on a stretcher by non emergent EMS,  no distress noted upon discharge.  Aarya Robinson C 10/20/2013 2:28 PM

## 2013-12-19 ENCOUNTER — Encounter: Payer: Self-pay | Admitting: Internal Medicine

## 2013-12-19 ENCOUNTER — Ambulatory Visit (INDEPENDENT_AMBULATORY_CARE_PROVIDER_SITE_OTHER): Payer: 59 | Admitting: Internal Medicine

## 2013-12-19 VITALS — BP 130/80 | HR 77 | Temp 98.3°F

## 2013-12-19 DIAGNOSIS — R29898 Other symptoms and signs involving the musculoskeletal system: Secondary | ICD-10-CM

## 2013-12-19 DIAGNOSIS — D518 Other vitamin B12 deficiency anemias: Secondary | ICD-10-CM

## 2013-12-19 DIAGNOSIS — R52 Pain, unspecified: Secondary | ICD-10-CM

## 2013-12-19 DIAGNOSIS — R9431 Abnormal electrocardiogram [ECG] [EKG]: Secondary | ICD-10-CM

## 2013-12-19 DIAGNOSIS — E876 Hypokalemia: Secondary | ICD-10-CM

## 2013-12-19 DIAGNOSIS — Z8659 Personal history of other mental and behavioral disorders: Secondary | ICD-10-CM

## 2013-12-19 LAB — CBC WITH DIFFERENTIAL/PLATELET
Basophils Absolute: 0 10*3/uL (ref 0.0–0.1)
Basophils Relative: 0.3 % (ref 0.0–3.0)
Eosinophils Absolute: 0.1 10*3/uL (ref 0.0–0.7)
Eosinophils Relative: 2 % (ref 0.0–5.0)
HCT: 42.6 % (ref 36.0–46.0)
HEMOGLOBIN: 14.2 g/dL (ref 12.0–15.0)
LYMPHS PCT: 38.1 % (ref 12.0–46.0)
Lymphs Abs: 2.7 10*3/uL (ref 0.7–4.0)
MCHC: 33.4 g/dL (ref 30.0–36.0)
MCV: 90.8 fl (ref 78.0–100.0)
MONOS PCT: 6 % (ref 3.0–12.0)
Monocytes Absolute: 0.4 10*3/uL (ref 0.1–1.0)
Neutro Abs: 3.9 10*3/uL (ref 1.4–7.7)
Neutrophils Relative %: 53.6 % (ref 43.0–77.0)
PLATELETS: 279 10*3/uL (ref 150.0–400.0)
RBC: 4.69 Mil/uL (ref 3.87–5.11)
RDW: 14 % (ref 11.5–14.6)
WBC: 7.2 10*3/uL (ref 4.5–10.5)

## 2013-12-19 LAB — VITAMIN B12: VITAMIN B 12: 169 pg/mL — AB (ref 211–911)

## 2013-12-19 LAB — BASIC METABOLIC PANEL
BUN: 10 mg/dL (ref 6–23)
CALCIUM: 9.4 mg/dL (ref 8.4–10.5)
CO2: 26 mEq/L (ref 19–32)
Chloride: 106 mEq/L (ref 96–112)
Creatinine, Ser: 0.5 mg/dL (ref 0.4–1.2)
GFR: 146.66 mL/min (ref >60.00)
GLUCOSE: 90 mg/dL (ref 70–99)
Potassium: 4.3 mEq/L (ref 3.5–5.1)
SODIUM: 140 meq/L (ref 135–145)

## 2013-12-19 LAB — FOLATE: Folate: 7.6 ng/mL (ref >5.9)

## 2013-12-19 LAB — MAGNESIUM: MAGNESIUM: 1.7 mg/dL (ref 1.5–2.5)

## 2013-12-19 NOTE — Progress Notes (Signed)
Chief Complaint  Patient presents with  . Hospitalization Follow-up    HPI: Patient comes in today as follow up from hospitalization for "couldn t walk" Last visit here was almost 3 years ago .  Apparently was dced to skilled nursing  Was hops 11 14  For hypokalemia  2.8 of unkown etiology and "le weakness" and urinary retention had ns consultand  And now is on schedule for "FU"  And refused pt/ ot  At the time .  Also  Had ? uti rx  But not confirmed buu cx Prolonged qt ? If improved at end of hosp.  Borderline low b12 noted  Depression  Began on remeron and fu dr Evelene CroonKaur advised Denies  Need for Psychiatrist. Psych and neuro consults  Neuropathy. ? If dx  Dr. Nelson ChimesAmin. She just got out of skilled nursing December 18. Do not have that discharge summary or note. From snif. Falls at home  If tries to walk  at home Home  December 18th.  Living by self.  Has ?or to get a helper  Asks excuse from jury duty  ROS: See pertinent positives and negatives per HPI. No current chest pain shortness of breath change in vision vomiting diarrhea. Has hx of etoh use depression and behavioral health admissions  Related to marriage break up  Past Medical History  Diagnosis Date  . GERD (gastroesophageal reflux disease)     on nexium since 1989 ? had esoph ulceration reported egd not in record  . History of esophageal ulcer   . Hyperlipidemia   . Osteoporosis     by dexa in the past failed orals has GERD last dexa 4/11 -1.4, -1.5 relast Jan 2010  . Fracture     hx in the past  . 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 Primiparous   . DJD (degenerative joint disease)     MRI spine DJD 2011  ack surgery consult.  . Depression     decrease sleep, [facility]  . Sleep disorder     decreased  . B12 deficiency     gives her own shots  . Fibromyalgia     ? who made this dx. ? rheum?  Marland Kitchen. Arthritis   . Dislocated shoulder     2008    Family History  Problem Relation Age of Onset  . Hypertension  Mother   . Osteoporosis Mother   . Kidney failure Mother   . Aneurysm Father     Brain    History   Social History  . Marital Status: Married    Spouse Name: N/A    Number of Children: N/A  . Years of Education: N/A   Social History Main Topics  . Smoking status: Never Smoker   . Smokeless tobacco: None  . Alcohol Use: Yes     Comment: OCCASIONAL  . Drug Use: No  . Sexual Activity: None   Other Topics Concern  . 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 gabapentin (NEURONTIN) 300 MG capsule Take 300 mg by  mouth 3 (three) times daily.  . mirtazapine (REMERON) 7.5 MG tablet Take 1 tablet (7.5 mg total) by mouth at bedtime.  . traMADol (ULTRAM) 50 MG tablet Take 0.5 tablets (25 mg total) by mouth every 6 (six) hours as needed.  . [DISCONTINUED] ibuprofen (ADVIL,MOTRIN) 400 MG tablet Take 1 tablet (400 mg total) by mouth every 6 (six) hours as needed for mild pain or moderate pain.  . [DISCONTINUED] methylPREDNISolone (MEDROL DOSEPAK) 4 MG tablet follow package directions  . [DISCONTINUED] tamsulosin (FLOMAX) 0.4 MG CAPS capsule Take 1 capsule (0.4 mg total) by mouth daily after breakfast.    EXAM:  BP 130/80  Pulse 77  Temp(Src) 98.3 F (36.8 C) (Oral)  SpO2 96%  There is no weight on file to calculate BMI.  GENERAL: vitals reviewed and listed above, alert, oriented, appears well hydrated and in no acute distress she is sitting in a wheelchair but can propel with her upper body. Alert interactive cooperative HEENT: atraumatic, conjunctiva  clear, no obvious abnormalities on inspection of external nose and ears TMs are  clear OP : no lesion edema or exudate  NECK: no obvious masses on inspection palpation no bruits are heard or adenopathy LUNGS: clear to auscultation bilaterally, no wheezes, rales or rhonchi, good air movement CV: HRRR, no clubbing cyanosis or  peripheral edema nl cap refill  MS: moves all extremities without noticeable focal  abnormality Neurologic cranial nerves appear intact no tremors noted and no color change of extremities pulses are intact did not attempt to check for motor weakness gait et Karie Soda. PSYCH: pleasant and cooperative,   ASSESSMENT AND PLAN:  Discussed the following assessment and plan:  Leg weakness, bilateral - Plan: gabapentin (NEURONTIN) 300 MG capsule, Basic metabolic panel, CBC with Differential, Vitamin B12, Folate, Ambulatory referral to Neurology  ANEMIA, VITAMIN B12 DEFICIENCY - Plan: gabapentin (NEURONTIN) 300 MG capsule, Basic metabolic panel, CBC with Differential, Vitamin B12, Folate  Hypokalemia - Plan: gabapentin (NEURONTIN) 300 MG capsule, Basic metabolic panel, CBC with Differential, Vitamin B12, Folate, Magnesium  Prolonged QT interval  Pain  Hx of major depression Hx of hosp for hypokalemia  November not defined  Underlying psych hx and remote  hx f opiate and etoh use and hx of "pain all over ". But better from that   However patient states she doesn't need to see a psychiatrist that she's doing well in this area ;  she states she is wheelchair bound in her lower extremities are weak when she tries to walk she falls.   Did not have a followup appointment with neurology neurosurgery or psychiatry as per hospital discharge note. We did discuss some followup .    She agrees to neurology followup will make a referral. She can continue on the gabapentin and call for refills. We'll not be treating depression or psychiatric disease if flares up. We can refer her to her appointments at that time.  Uncertain what to make of the urinary retention  except for was related to the hypokalemia whatever caused back.  There was no drug screen done at the time of her admission.  Unclear if she has some type of neuropathy? Sensory with the pain  that needs further evaluation. ? If b12 contributing or other nutritional issue over time.   She is currently living at home by herself with a wheelchair but self propels and is getting someone to come in and help. He may be looking for another place to reside for help.  Will write  note for JURY duty   Review record uncertain if getting b12  Patient Instructions  Need to repeat  Labs    Today to check the  Potassium level.  Someone will contact   You about neurology appt.  For the weakness and neuropathy.   Then fu depending on labs and neuro appt.   ROV in 2 months   Neta Mends. Panosh M.D.

## 2013-12-19 NOTE — Patient Instructions (Addendum)
Need to repeat  Labs    Today to check the  Potassium level.  Someone will contact   You about neurology appt.  For the weakness and neuropathy.   Then fu depending on labs and neuro appt.   ROV in 2 months

## 2013-12-19 NOTE — Progress Notes (Signed)
Pre visit review using our clinic review tool, if applicable. No additional management support is needed unless otherwise documented below in the visit note. 

## 2013-12-22 ENCOUNTER — Telehealth: Payer: Self-pay | Admitting: Internal Medicine

## 2013-12-22 NOTE — Telephone Encounter (Signed)
Pt calling to check the status of jury duty excuse letter from PCP and also lab results done on 12/19/13.

## 2013-12-23 ENCOUNTER — Encounter: Payer: Self-pay | Admitting: Internal Medicine

## 2013-12-23 NOTE — Telephone Encounter (Signed)
Letter is done  See result note Tell patient labs show low   B12 level but rest normal . Would like her to get x tra  Lab tests called [facility] ( methyl malonic acid level )    And repeat level b12   And intrinsic factor antibody . After this can begin 1000 mcg of vit b12 per day  Sometimes we use injections.

## 2013-12-23 NOTE — Telephone Encounter (Signed)
Pt calling back again for the status of her letter [place]  Please call back

## 2013-12-24 ENCOUNTER — Encounter: Payer: Self-pay | Admitting: Internal Medicine

## 2013-12-24 DIAGNOSIS — Z8659 Personal history of other mental and behavioral disorders: Secondary | ICD-10-CM | POA: Insufficient documentation

## 2013-12-26 ENCOUNTER — Telehealth: Payer: Self-pay | Admitting: Internal Medicine

## 2013-12-26 ENCOUNTER — Other Ambulatory Visit: Payer: Self-pay | Admitting: Family Medicine

## 2013-12-26 DIAGNOSIS — D518 Other vitamin B12 deficiency anemias: Secondary | ICD-10-CM

## 2013-12-26 NOTE — Telephone Encounter (Signed)
Pt notified of results by telephone.  She will have to call back for a lab appointment because she is ride dependent.  Mailed letter to her per her request.

## 2013-12-27 NOTE — Telephone Encounter (Signed)
Pt would like misty to return her call

## 2013-12-27 NOTE — Telephone Encounter (Signed)
Pt would like to increase her gabapentin (NEURONTIN) 300 MG capsule. Pt doesn't feel like the 900 mg /day is doing anything, pt request higher dose. [facility] [address] Pt has march appt

## 2013-12-27 NOTE — Telephone Encounter (Signed)
Can increase to 600 mg 300 mg 300 mg for week and then 600 600 300 for a week and then 600 tid .   Slow increase to avoid sever sedation .

## 2013-12-27 NOTE — Telephone Encounter (Signed)
[facility] Triage Call Report Triage Record Num: [id number] Operator: Albertine GratesLori Steuer [name] Call Date & Time: 12/26/2013 5:12:32PM Patient Phone: [phone] PCP: Neta MendsWanda K. Panosh Patient Gender: Female PCP Fax : [phone] Patient DOB: 07/10/1951 [facility]  Reason for Call: Caller: [name]; PCP: Berniece AndreasPanosh, Wanda (Family Practice); CB#: [phone]; [name], advised appointment due to treatment not helping. Does not want to come back in as was seen 1-5. Advised call office 1-13 for possible change in dosing of current medicines until sees neurology 2-12.

## 2013-12-29 ENCOUNTER — Telehealth: Payer: Self-pay | Admitting: Internal Medicine

## 2013-12-29 NOTE — Telephone Encounter (Signed)
Spoke to the pt.  See other note.

## 2013-12-29 NOTE — Telephone Encounter (Signed)
Patient Information:  Caller Name: Adriana Figueroa  Phone: [phone]  Patient: Adriana Figueroa, Adriana Figueroa  Gender: Female  DOB: 03/18/1951  Age: 63 Years  PCP: Berniece AndreasPanosh, Wanda (Family Practice)  Office Follow Up:  Does the office need to follow up with this patient?: Yes  Instructions For The Office: Please f/u with medication request, thank you.   Symptoms  Reason For Call & Symptoms: Pt requesting increase in dosage for Gabapentin 300 mg 3 x day.  Pt reports she was on 900mg  in the hospital and the current dose is not helping.  [facility]  [address]  Reviewed Health History In EMR: Yes  Reviewed Medications In EMR: Yes  Reviewed Allergies In EMR: Yes  Reviewed Surgeries / Procedures: Yes  Date of Onset of Symptoms: 12/29/2013  Guideline(s) Used:  No Protocol Available - Information Only  Disposition Per Guideline:   Discuss with PCP and Callback by Nurse Today  Reason For Disposition Reached:   Nursing judgment  Advice Given:  Call Back If:  New symptoms develop  You become worse.  Patient Will Follow Care Advice:  YES

## 2013-12-29 NOTE — Telephone Encounter (Signed)
Spoke to the pt.  Had her write down all information.  She does not need a refill at this time.  She will call and let me know when she needs a refill and let me know what week she is on.  Week 1) 600mg  am   300mg  mid day   And 300 in the evening Week 2) 600       600      300 Week 3) 600  600  600

## 2014-01-09 ENCOUNTER — Telehealth: Payer: Self-pay | Admitting: Internal Medicine

## 2014-01-09 NOTE — Telephone Encounter (Addendum)
Pt would like a handicap form due being in   wheel chair. Pt also would like a copy of her blood work results

## 2014-01-09 NOTE — Telephone Encounter (Signed)
Patient notified that Dr. Fabian SharpPanosh is out of the office for the week.  Will forward the message to her.

## 2014-01-10 NOTE — Telephone Encounter (Signed)
Pt would like you to mail lab results to her from last blood work. Pt in wheel chair and difficult to get here.

## 2014-01-10 NOTE — Telephone Encounter (Signed)
Sent by mail

## 2014-01-15 NOTE — Telephone Encounter (Signed)
Ok to do handicapped  Form when i get back in office . Please get it ready.

## 2014-01-16 NOTE — Telephone Encounter (Signed)
Patient notified to pick up at the front desk. 

## 2014-01-16 NOTE — Telephone Encounter (Signed)
Placed on WP's desk.  Waiting on signature.

## 2014-01-19 ENCOUNTER — Ambulatory Visit: Payer: Self-pay | Admitting: Neurology

## 2014-01-20 ENCOUNTER — Encounter: Payer: Self-pay | Admitting: Neurology

## 2014-01-24 ENCOUNTER — Encounter: Payer: Self-pay | Admitting: Neurology

## 2014-01-24 ENCOUNTER — Ambulatory Visit (INDEPENDENT_AMBULATORY_CARE_PROVIDER_SITE_OTHER): Payer: 59 | Admitting: Neurology

## 2014-01-24 VITALS — BP 94/70 | HR 64 | Temp 97.5°F | Resp 12

## 2014-01-24 DIAGNOSIS — E538 Deficiency of other specified B group vitamins: Secondary | ICD-10-CM

## 2014-01-24 DIAGNOSIS — R29898 Other symptoms and signs involving the musculoskeletal system: Secondary | ICD-10-CM

## 2014-01-24 NOTE — Progress Notes (Signed)
[facility] Clinic Note - Initial Visit   Date: 01/24/2014    Adriana Figueroa MRN: [id number] DOB: [birth year]   Dear Dr Fabian Sharp:  Adriana Figueroa is seen in consultation for leg weakness. Although her history is well known to you, please allow [place] to reiterate it for the purpose of our medical record. The patient was accompanied to the clinic by self.    History of Present Illness: Adriana Figueroa is a 63 y.o. right-handed [place] female with history of GERD, hyperlipidemia, vitamin B12 deficiency, and fibromyalgia presenting for evaluation of leg weakness and whole body sensory loss. She arrived at today's visit in a wheel chair.  Upon further questioning to learn about the pattern of her symptomology (onset, location, progression), she became very abrasive and started yelling "I had high expectations that you would be able to tell my what's wrong and that I would be able to walk today!"  I tried explaining that I was only asking specific questions to better understand her weakness and sensory loss and without knowing the problem, I cannot help fix it.    Per patient's report, she started falling repeatedly a few months ago and called 911.  She went to the hospital and was told she had hypokalemia and discharged to rehab facility where she stayed for one month.  She currently lives at home alone.  When asked about ADLs, she says that she can't do anything for herself and spends her day laying in the bed.  I specifically asked about bathing, feeding, and toileting to which she replied "I don't take a bath" and "I use a bedpan".  I questioned who changes her bedpan since she lives alone, she replied "I have plastic bags and throw it in the garbage".  Patient refused to provide details further details regarding her history or if there was any preceding illness, infection, or trauma.   Her sensory loss is whole body and described as "I can't feel anything anywhere!"    After  reviewing her Epic chart, she was recently hospitalized from 11/1 - 10/20/2013 for 45-month history of generalized weakness and inability to walk.  She was found to be hypokalemic at 2.8 and had a UTI, but despite correcting her potassium and treating her UTI, she remained weak.  MRI lumbar spine showed broad-based foraminal and extraforaminal disc protrusions on the left at L3-4 and L4-5 contributing to only mild foraminal and lateral recess stenosis which was unchanged from her prior study in 2011.  Neurology was consulted and gathered onset of symptoms occurred when her husband suddenly left her without warning in May of 2011 after 35 years of marriage. Her symptoms have progressed since that time and recommended psychiatry consultation.  Psychiatry started remeron for conversion disorder and depressed mood and referred for out-patient evaluation.  Of note, she was recently found to have B12 deficiency (169) and was recommended to take oral supplementation, but I am unsure if she is taking this or not.   Out-side paper records, electronic medical record, and images have been reviewed where available and summarized as:  MRI lumbar spine 10/15/2013: No acute findings or significant changes are demonstrated compared with the prior study from 2011. There are broad-based foraminal and extraforaminal disc protrusions on the left at L3-4 and L4-5 contributing to only mild foraminal and lateral recess stenosis as described.   Lab Results  Component Value Date   [id number] 169* 12/19/2013   Lab Results  Component Value Date  CKTOTAL 16 10/15/2013   CKMB 0.8 10/15/2013   Lab Results  Component Value Date   TSH 2.200 10/15/2013     Past Medical History  Diagnosis Date  . GERD (gastroesophageal reflux disease)     on nexium since 1989 ? had esoph ulceration reported egd not in record  . History of esophageal ulcer   . Hyperlipidemia   . Osteoporosis     by dexa in the past failed orals has GERD last  dexa 4/11 -1.4, -1.5 relast Jan 2010  . Fracture     hx in the past  . 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 Primiparous   . DJD (degenerative joint disease)     MRI spine DJD 2011  ack surgery consult.  . Depression     decrease sleep, [facility]  . Sleep disorder     decreased  . B12 deficiency     gives her own shots  . Fibromyalgia     ? who made this dx. ? rheum?  Marland Kitchen. Arthritis   . Dislocated shoulder     2008    No past surgical history on file.   Medications:  Current Outpatient Prescriptions on File Prior to Visit  Medication Sig Dispense Refill  . gabapentin (NEURONTIN) 300 MG capsule Take 300 mg by mouth 3 (three) times daily.       No current facility-administered medications on file prior to visit.    Allergies:  Allergies  Allergen Reactions  . Penicillins   . Vicodin [Hydrocodone-Acetaminophen]     GI upset    Family History: Family History  Problem Relation Age of Onset  . Hypertension Mother   . Osteoporosis Mother   . Kidney failure Mother   . Aneurysm Father     Brain    Social History: History   Social History  . Marital Status: Married    Spouse Name: N/A    Number of Children: N/A  . Years of Education: N/A   Occupational History  . Not on file.   Social History Main Topics  . Smoking status: Never Smoker   . Smokeless tobacco: Not on file  . Alcohol Use: Yes     Comment: OCCASIONAL  . Drug Use: No  . Sexual Activity: Not on file   Other Topics Concern  . 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   EYES: No visual changes or eye pain ENT:  No hearing changes.  No history of nose bleeds.   RESPIRATORY: No cough, wheezing and shortness of breath.   CARDIOVASCULAR: Negative for chest pain, and palpitations.   GI: Negative for abdominal discomfort, blood in stools or black stools.  No recent change in bowel habits.   GU:  No history of incontinence.   MUSCLOSKELETAL: +history of joint pain or swelling.  +myalgias.   SKIN: Negative for lesions, rash, and itching.   HEMATOLOGY/ONCOLOGY: Negative for prolonged bleeding, bruising easily, and swollen nodes.  No history of cancer.   ENDOCRINE: Negative for  cold or heat intolerance, polydipsia or goiter.   PSYCH:  +depression or anxiety symptoms.   NEURO: As Above.   Vital Signs:  BP 94/70  Pulse 64  Temp(Src) 97.5 F (36.4 C)  Resp 12   Exam is limited as patient was unhappy with my care and left during the interview General:   Thin-appearing, she appears groomed and clean, arrived in wheel chair  Neurological Exam: Mental status:  Awake, alert, yelling during the interview, uncooperative with exam   CRANIAL NERVES: II:  No visual field defects.  Unremarkable fundi.   III-IV-VI: Pupils equal round and reactive to light.  Normal conjugate, extra-ocular eye movements in all directions of gaze.  No ptosis.   VII:  Normal facial symmetry and movements.  VIII:  Normal hearing and vestibular function.   IX-X:  Normal palatal movement.    MOTOR:  Very thin appearing, motor strength is at least antigravity in all extremities.  No fasciculations or abnormal movements are seen.  Tone is normal.    MSRs:  Right                                                                 Left brachioradialis 2+  brachioradialis 2+  biceps 2+  biceps 2+  triceps 2+  triceps 2+  patellar 0  Patellar 0  ankle jerk 0  ankle jerk 0  Hoffman no  Hoffman no   SENSORY:  Patchy loss of pin prick over the left arm and right lower leg, absent vibration at the forehead, right MCP, and left knee but  intact elsewhere.    COORDINATION/GAIT:  Slowed finger tapping bilaterally.    When asked to see if she can stand to walk, she became irrate and screamed "Do you want me to fall?!   You want to see me fall?!"  Despite my attempts to calm her and trying to explain that assessing gait is part of my assessment and it's okay if she is unable to do so, she started berating me saying she "should have know better than to come see a 'Allena Katz!'".  She continued to be derogatory and decided that she did not to be evaluated any longer and wheeled herself out of the exam room.   IMPRESSION/PLAN: Unfortunately, due to patient prematurely terminating the visit today, I was only able to perform a very limited history and exam so no meaningful conclusions can be made.  I would recommend more aggressive vitamin B12 supplementation with injections since her levels were so low and may cause peripheral neuropathy, weakness, and fatigue, but I was unable to convey this information to her as she already left. I will also ask that her PCP looks into her home safety as she lives alone and reportedly does not perform her ADLs.  I apologized to the patient that I did not meet her expectations for neurological care.     The duration of this appointment visit was 25 minutes of face-to-face time with the patient.  Greater than 50% of this time was spent in counseling, explanation of diagnosis, planning of further management, and coordination of care.   Thank you for allowing me to participate in patient's care.  If I can answer any additional questions, I would be pleased to do  so.    Sincerely,    Cordelro Gautreau K. Posey Pronto, DO

## 2014-01-25 ENCOUNTER — Telehealth: Payer: Self-pay | Admitting: Internal Medicine

## 2014-01-25 NOTE — Telephone Encounter (Signed)
Pt would like another referral to [facility] for leg weakness and pins and needles in hands  Etc. Pt did not want to see dr patel again.

## 2014-01-26 NOTE — Telephone Encounter (Signed)
Pt following up on request to go to [facility]

## 2014-01-26 NOTE — Telephone Encounter (Signed)
Pt calling to check status of request.  Pt was informed [facility] will be [facility] per PCP's approval.

## 2014-01-26 NOTE — Telephone Encounter (Signed)
Can refer but send the note from dr Allena KatzPatel to them also

## 2014-01-27 NOTE — Telephone Encounter (Signed)
[facility]  Please call pt with the status of referral.

## 2014-01-27 NOTE — Telephone Encounter (Signed)
Pt aware referral will be sent.

## 2014-02-01 ENCOUNTER — Telehealth: Payer: Self-pay | Admitting: Internal Medicine

## 2014-02-01 ENCOUNTER — Encounter: Payer: Self-pay | Admitting: Neurology

## 2014-02-01 DIAGNOSIS — D518 Other vitamin B12 deficiency anemias: Secondary | ICD-10-CM

## 2014-02-01 DIAGNOSIS — E876 Hypokalemia: Secondary | ICD-10-CM

## 2014-02-01 DIAGNOSIS — R29898 Other symptoms and signs involving the musculoskeletal system: Secondary | ICD-10-CM

## 2014-02-01 NOTE — Telephone Encounter (Signed)
Pt states she mistakenly requested a referral to the wrong doctor, pt is needed a referral for [facility]-[phone].

## 2014-02-01 NOTE — Telephone Encounter (Signed)
Pt states dr Fabian Sharppanosh increased her gabapentin (NEURONTIN) 300 MG capsule From 1 / 3 x @ day to 2 / 3 x @ day. No new rx was given and now pt is out of med and the pharm will not refill early. Pt needs new rx [facility] to: [facility] [address]

## 2014-02-01 NOTE — Telephone Encounter (Signed)
[facility]

## 2014-02-01 NOTE — Telephone Encounter (Signed)
Can [facility] do [facility]?  Do you need a new order?

## 2014-02-01 NOTE — Telephone Encounter (Signed)
[facility]

## 2014-02-02 ENCOUNTER — Ambulatory Visit: Payer: Self-pay | Admitting: Family Medicine

## 2014-02-02 MED ORDER — GABAPENTIN 300 MG PO CAPS: 600.0000 mg | ORAL_CAPSULE | Freq: Three times a day (TID) | ORAL | Status: DC

## 2014-02-02 NOTE — Telephone Encounter (Signed)
Medication sent to the pharmacy.  Do you need a new referral to change neuro?

## 2014-02-06 ENCOUNTER — Other Ambulatory Visit: Payer: Self-pay | Admitting: Family Medicine

## 2014-02-06 DIAGNOSIS — R29898 Other symptoms and signs involving the musculoskeletal system: Secondary | ICD-10-CM

## 2014-02-08 ENCOUNTER — Telehealth: Payer: Self-pay | Admitting: Neurology

## 2014-02-08 NOTE — Telephone Encounter (Signed)
Patient dismissed from [facility] by Nita Sickleonika Patel D.O. , effective February 01, 2014. Dismissal letter sent out by certified / registered mail. DAJ

## 2014-02-20 ENCOUNTER — Ambulatory Visit: Payer: Self-pay | Admitting: Neurology

## 2014-02-21 ENCOUNTER — Encounter: Payer: Self-pay | Admitting: Internal Medicine

## 2014-02-21 NOTE — Progress Notes (Signed)
Document opened and reviewed for OV but appt  canceled same day .  

## 2014-02-24 ENCOUNTER — Other Ambulatory Visit: Payer: Self-pay | Admitting: Internal Medicine

## 2014-02-24 NOTE — Telephone Encounter (Signed)
Are you doing this or should Patel's office take this over?

## 2014-03-02 NOTE — Telephone Encounter (Signed)
Refill this medicine for 2 months His make sure she is going to see the neurologist of her choice  and at that time they may be able to advise in take of this medication.

## 2014-03-02 NOTE — Telephone Encounter (Addendum)
Pt states she walked out of patel's office  Does not want to see this dr patel! Dr Fabian SharpPanosh filled this for her the last time. Pls advise Pt is almost out of med. Called in 3/13. Pt states b/c of her pain, she cannot sleep. Hasn't slept in weeks. Would like to know if you would gove her something to rest. [facility]

## 2014-03-03 ENCOUNTER — Other Ambulatory Visit: Payer: Self-pay | Admitting: Family Medicine

## 2014-03-03 ENCOUNTER — Telehealth: Payer: Self-pay | Admitting: Family Medicine

## 2014-03-03 MED ORDER — TRAZODONE HCL 50 MG PO TABS: 25.0000 mg | ORAL_TABLET | Freq: Every evening | ORAL | Status: DC | PRN

## 2014-03-03 NOTE — Telephone Encounter (Signed)
Tried calling X 2 to ask questions about sleep habits.  Left a message for the pt to return my call.

## 2014-03-03 NOTE — Telephone Encounter (Signed)
Spoke to the pt.  She stated she is having trouble falling asleep due to the pain in her body.  Once she is asleep she does not have trouble staying asleep. Per WP, okay to send in traZODone (DESYREL) 50 MG tablet to take 25-50 mg qhs PRN #30 with 0 additional refills. Pt notified.

## 2014-03-03 NOTE — Telephone Encounter (Signed)
Pt would like a medication to help her sleep.

## 2014-03-06 ENCOUNTER — Ambulatory Visit: Payer: Self-pay | Admitting: Neurology

## 2014-03-20 ENCOUNTER — Ambulatory Visit: Payer: Self-pay | Admitting: Neurology

## 2014-03-21 DIAGNOSIS — Z0289 Encounter for other administrative examinations: Secondary | ICD-10-CM

## 2014-03-23 ENCOUNTER — Other Ambulatory Visit: Payer: Self-pay | Admitting: Internal Medicine

## 2014-03-23 NOTE — Telephone Encounter (Signed)
Ok to refill x 3 

## 2014-03-26 ENCOUNTER — Encounter (HOSPITAL_COMMUNITY): Payer: Self-pay | Admitting: Emergency Medicine

## 2014-03-26 ENCOUNTER — Emergency Department (HOSPITAL_COMMUNITY): Payer: 59

## 2014-03-26 ENCOUNTER — Emergency Department (HOSPITAL_COMMUNITY)
Admission: EM | Admit: 2014-03-26 | Discharge: 2014-03-26 | Disposition: A | Discharge status: Home / Self Care | Payer: 59 | Attending: Emergency Medicine | Admitting: Emergency Medicine

## 2014-03-26 DIAGNOSIS — Z79899 Other long term (current) drug therapy: Secondary | ICD-10-CM | POA: Insufficient documentation

## 2014-03-26 DIAGNOSIS — E785 Hyperlipidemia, unspecified: Secondary | ICD-10-CM | POA: Insufficient documentation

## 2014-03-26 DIAGNOSIS — F3289 Other specified depressive episodes: Secondary | ICD-10-CM | POA: Insufficient documentation

## 2014-03-26 DIAGNOSIS — Z880 Allergy status to penicillin: Secondary | ICD-10-CM | POA: Insufficient documentation

## 2014-03-26 DIAGNOSIS — M79672 Pain in left foot: Secondary | ICD-10-CM

## 2014-03-26 DIAGNOSIS — M1990 Unspecified osteoarthritis, unspecified site: Secondary | ICD-10-CM | POA: Insufficient documentation

## 2014-03-26 DIAGNOSIS — M810 Age-related osteoporosis without current pathological fracture: Secondary | ICD-10-CM | POA: Insufficient documentation

## 2014-03-26 DIAGNOSIS — M79671 Pain in right foot: Secondary | ICD-10-CM

## 2014-03-26 DIAGNOSIS — K219 Gastro-esophageal reflux disease without esophagitis: Secondary | ICD-10-CM | POA: Insufficient documentation

## 2014-03-26 DIAGNOSIS — M25579 Pain in unspecified ankle and joints of unspecified foot: Secondary | ICD-10-CM | POA: Insufficient documentation

## 2014-03-26 DIAGNOSIS — R609 Edema, unspecified: Secondary | ICD-10-CM | POA: Insufficient documentation

## 2014-03-26 DIAGNOSIS — M7989 Other specified soft tissue disorders: Secondary | ICD-10-CM

## 2014-03-26 DIAGNOSIS — M79609 Pain in unspecified limb: Secondary | ICD-10-CM

## 2014-03-26 DIAGNOSIS — M129 Arthropathy, unspecified: Secondary | ICD-10-CM | POA: Insufficient documentation

## 2014-03-26 DIAGNOSIS — F329 Major depressive disorder, single episode, unspecified: Secondary | ICD-10-CM | POA: Insufficient documentation

## 2014-03-26 MED ORDER — TRAMADOL HCL 50 MG PO TABS: 50.0000 mg | ORAL_TABLET | Freq: Four times a day (QID) | ORAL | Status: DC | PRN

## 2014-03-26 MED ORDER — TRAMADOL HCL 50 MG PO TABS
50.0000 mg | ORAL_TABLET | Freq: Once | ORAL | Status: AC
  Administered 2014-03-26: 50 mg via ORAL
  Filled 2014-03-26: qty 1

## 2014-03-26 NOTE — ED Notes (Signed)
Patient transported to Ultrasound 

## 2014-03-26 NOTE — ED Notes (Signed)
Pt here for RLE swelling and pain x3 days.  [facility] and sent here for venous doppler to r/o DVT.  No cp or sob with this

## 2014-03-26 NOTE — Discharge Instructions (Signed)
Arthralgia Your caregiver has diagnosed you as suffering from an arthralgia. Arthralgia means there is pain in a joint. This can come from many reasons including:  Bruising the joint which causes soreness (inflammation) in the joint.  Wear and tear on the joints which occur as we grow older (osteoarthritis).  Overusing the joint.  Various forms of arthritis.  Infections of the joint. Regardless of the cause of pain in your joint, most of these different pains respond to anti-inflammatory drugs and rest.  HOME CARE INSTRUCTIONS   Rest the injured area for as long as directed by your caregiver. Then slowly start using the joint as directed by your caregiver and as the pain allows. Crutches as directed may be useful if the ankles, knees or hips are involved. If the knee was splinted or casted, continue use and care as directed. If an stretchy or elastic wrapping bandage has been applied today, it should be removed and re-applied every 3 to 4 hours. It should not be applied tightly, but firmly enough to keep swelling down. Watch toes and feet for swelling, bluish discoloration, coldness, numbness or excessive pain. If any of these problems (symptoms) occur, remove the ace bandage and re-apply more loosely. If these symptoms persist, contact your caregiver or return to this location.  For the first 24 hours, keep the injured extremity elevated on pillows while lying down.  Apply ice for 15-20 minutes to the sore joint every couple hours while awake for the first half day. Then 03-04 times per day for the first 48 hours. Put the ice in a plastic bag and place a towel between the bag of ice and your skin.  Wear any splinting, casting, elastic bandage applications, or slings as instructed.  Only take over-the-counter or prescription medicines for pain, discomfort, or fever as directed by your caregiver. Do not use aspirin immediately after the injury unless instructed by your physician. Aspirin can  cause increased bleeding and bruising of the tissues.  If you were given crutches, continue to use them as instructed and do not resume weight bearing on the sore joint until instructed. Persistent pain and inability to use the sore joint as directed for more than 2 to 3 days are warning signs indicating that you should see a caregiver for a follow-up visit as soon as possible. Initially, a hairline fracture (break in bone) may not be evident on X-rays. Persistent pain and swelling indicate that further evaluation, non-weight bearing or use of the joint (use of crutches or slings as instructed), or further X-rays are indicated. X-rays may sometimes not show a small fracture until a week or 10 days later. Make a follow-up appointment with your own caregiver or one to whom we have referred you. A radiologist (specialist in reading X-rays) may read your X-rays. Make sure you know how you are to obtain your X-ray results. Do not assume everything is normal if you do not hear from [place]. SEEK MEDICAL CARE IF: Bruising, swelling, or pain increases. SEEK IMMEDIATE MEDICAL CARE IF:   Your fingers or toes are numb or blue.  The pain is not responding to medications and continues to stay the same or get worse.  The pain in your joint becomes severe.  You develop a fever over 102 F (38.9 C).  It becomes impossible to move or use the joint. MAKE SURE YOU:   Understand these instructions.  Will watch your condition.  Will get help right away if you are not doing well or  get worse. Document Released: 12/01/2005 Document Revised: 02/23/2012 Document Reviewed: 07/19/2008 ExitCare Patient Information 2014 [employer].  YOUR EXAM TODAY DOES NOT REVEAL A CLOT IN YOUR LEG.  PLEASE FOLLOW-UP WITH YOUR PRIMARY CARE PROVIDER.

## 2014-03-26 NOTE — ED Notes (Signed)
Onset 3 days right LE pain and swelling.  Swelling and redness at ankle.  Left ankle and leg is started to hurt as well.  No shortness of breath or chest pain.

## 2014-03-26 NOTE — ED Provider Notes (Signed)
CSN: [id number]     Arrival date & time 03/26/14  1525 History  This chart was scribed for non-physician practitioner, Adriana Morn, NP-C working with Adriana Octave, MD by Greggory Stallion, ED scribe. This patient was seen in room TR06C/TR06C and the patient's care was started at 4:40 PM.    Chief Complaint  Patient presents with  . DVT    Patient is a 63 y.o. female presenting with leg pain. The history is provided by the patient. No language interpreter was used.  Leg Pain Location:  Leg Time since incident:  3 days Injury: no   Leg location:  R leg Pain details:    Severity:  Moderate   Progression:  Worsening Chronicity:  New Associated symptoms: swelling     HPI Comments: Adriana Figueroa is a 63 y.o. female who presents to the Emergency Department complaining of gradually-worsening right leg pain with associated swelling that began 3 days ago.  Pt was seen at [facility] and sent here for venous doppler to rule out DVT.  She denies associated CP or SOB.  She denies recent injury.  She denies h/o gout.  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  She has used a wheelchair since December 2014 and has needed a walker for ambulation.   PCP is Lorretta Harp, MD    Past Medical History  Diagnosis Date  . GERD (gastroesophageal reflux disease)     on nexium since 1989 ? had esoph ulceration reported egd not in record  . History of esophageal ulcer   . Hyperlipidemia   . Osteoporosis     by dexa in the past failed orals has GERD last dexa 4/11 -1.4, -1.5 relast Jan 2010  . Fracture     hx in the past  . 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 Primiparous   . DJD (degenerative joint disease)     MRI spine DJD 2011  ack surgery consult.  . Depression     [facility]  . Sleep disorder     decreased  . B12 deficiency     gives her own shots  . Fibromyalgia     ? who made this dx. ? rheum?  Marland Kitchen Arthritis   . Dislocated shoulder      2008    History reviewed. No pertinent past surgical history.   Family History  Problem Relation Age of Onset  . Hypertension Mother   . Osteoporosis Mother   . Kidney failure Mother   . Aneurysm Father     Brain    History  Substance Use Topics  . Smoking status: Never Smoker   . Smokeless tobacco: Not on file  . Alcohol Use: Yes     Comment: OCCASIONAL    OB History   Grav Para Term Preterm Abortions TAB SAB Ect Mult Living                   Review of Systems  Cardiovascular: Positive for leg swelling.  Musculoskeletal: Positive for myalgias.  All other systems reviewed and are negative.    Allergies  Penicillins and Vicodin  Home Medications   Current Outpatient Rx  Name  Route  Sig  Dispense  Refill  . Calcium Citrate-Vitamin D (CITRACAL + D PO)   Oral   Take 1 tablet by mouth daily.         . celecoxib (CELEBREX) 200 MG capsule   Oral   Take  200 mg by mouth daily.         Marland Kitchen. FOLIC ACID PO   Oral   Take 1 tablet by mouth daily.         Marland Kitchen. gabapentin (NEURONTIN) 300 MG capsule   Oral   Take 600 mg by mouth 3 (three) times daily.         Marland Kitchen. MAGNESIUM PO   Oral   Take 1 tablet by mouth daily.         . Multiple Vitamin (MULTIVITAMIN WITH MINERALS) TABS tablet   Oral   Take 1 tablet by mouth daily.         . Omega-3 Fatty Acids (FISH OIL PO)   Oral   Take 1 capsule by mouth daily.         . traZODone (DESYREL) 50 MG tablet   Oral   Take 25-50 mg by mouth at bedtime as needed for sleep.          BP 117/75  Pulse 76  Temp(Src) 98.6 F (37 C) (Oral)  Resp 16  SpO2 97%  Physical Exam  Nursing note and vitals reviewed. Constitutional: She is oriented to person, place, and time. She appears well-developed and well-nourished. No distress.  HENT:  Head: Normocephalic and atraumatic.  Eyes: EOM are normal.  Neck: Neck supple. No tracheal deviation present.  Cardiovascular: Normal rate.   Pulmonary/Chest: Effort normal. No  respiratory distress.  Musculoskeletal:  Swelling to right medial malleolus, with tenderness to palpation. Complaining of pain to left medial malleolus.  No swelling.  Neurological: She is alert and oriented to person, place, and time.  Skin: Skin is warm and dry.  Psychiatric: She has a normal mood and affect. Her behavior is normal.    ED Course  Procedures (including critical care time)  DIAGNOSTIC STUDIES: Oxygen Saturation is 97% on RA, normal by my interpretation.    COORDINATION OF CARE: 4:45 PM-Informed pt that US was negative for DVT.  Discussed treatment plan which includes ankle x-ray with pt at bedside and pt agreed to plan.    Labs Review Labs Reviewed - No data to display  Imaging Review No results found.   EKG Interpretation None     No indication of DVT on doppler study.  No shortness of breath or chest pain. Patient reports limited mobility at home d/t neuropathy.  Uses a wheelchair primarily for mobility. Will discharge patient home with tramadol for pain management, follow-up with PCP. MDM   Final diagnoses:  None    Right foot pain.    I personally performed the services described in this documentation, which was scribed in my presence. The recorded information has been reviewed and is accurate.  [name] [name], NP 03/27/14 [phone]

## 2014-03-26 NOTE — Progress Notes (Signed)
VASCULAR LAB PRELIMINARY  PRELIMINARY  PRELIMINARY  PRELIMINARY  Right lower extremity venous Doppler completed.    Preliminary report:  There is no DVT or SVT noted in the right lower extremity.  There are enlarged lymph nodes noted in the right groin.  Kern AlbertaCandace R Etana Beets, RVT 03/26/2014, 4:16 PM

## 2014-03-26 NOTE — ED Notes (Signed)
Venous doppler ordered for RLE v.o from PA

## 2014-03-27 ENCOUNTER — Telehealth: Payer: Self-pay | Admitting: Internal Medicine

## 2014-03-27 NOTE — Telephone Encounter (Signed)
Pt request rx traMADol (ULTRAM) 50 MG tablet To [facility]

## 2014-03-27 NOTE — Telephone Encounter (Signed)
Pt seen in the ED on 03/26/14 for foot pain and given this medication. Please advise.  Thanks!

## 2014-03-27 NOTE — ED Provider Notes (Signed)
Medical screening examination/treatment/procedure(s) were performed by non-physician practitioner and as supervising physician I was immediately available for consultation/collaboration.   EKG Interpretation None       Glynn OctaveStephen Asaf Elmquist, MD 03/27/14 1052

## 2014-03-28 NOTE — Telephone Encounter (Signed)
Pt following up on med request. Pt states this med helps w/ her pain and swelling.

## 2014-03-28 NOTE — Telephone Encounter (Signed)
Will not be prescribing  controlled substance medications .  Does she need to see sport medicine /ortho ? Is this an injury ? If she feels needs more evaluation we can get sport medicine to see her.

## 2014-03-29 NOTE — Telephone Encounter (Signed)
Called and spoke to the pt.  She notified me that she had leg pain with her foot and ankle swelling.  Started last Friday.  She went to the emergency room.  She stated the tramadol takes away the pain.  Has also tried Aleve with no success.  Notified her that Dr. Fabian SharpPanosh will not be prescribing controlled substances for her.  Offered to help her with a referral to ortho or sports medicine.  Pt declined at this time.

## 2014-03-29 NOTE — Telephone Encounter (Signed)
Please call pt and let her know what Dr Fabian SharpPanosh decided

## 2014-04-05 ENCOUNTER — Encounter: Payer: Self-pay | Admitting: Internal Medicine

## 2014-04-17 ENCOUNTER — Ambulatory Visit (INDEPENDENT_AMBULATORY_CARE_PROVIDER_SITE_OTHER): Payer: 59 | Admitting: Neurology

## 2014-04-17 ENCOUNTER — Encounter: Payer: Self-pay | Admitting: Neurology

## 2014-04-17 ENCOUNTER — Telehealth: Payer: Self-pay | Admitting: *Deleted

## 2014-04-17 ENCOUNTER — Telehealth: Payer: Self-pay | Admitting: Internal Medicine

## 2014-04-17 VITALS — BP 117/73 | HR 70 | Ht 61.0 in | Wt 102.5 lb

## 2014-04-17 DIAGNOSIS — G609 Hereditary and idiopathic neuropathy, unspecified: Secondary | ICD-10-CM

## 2014-04-17 DIAGNOSIS — E538 Deficiency of other specified B group vitamins: Secondary | ICD-10-CM

## 2014-04-17 DIAGNOSIS — R269 Unspecified abnormalities of gait and mobility: Secondary | ICD-10-CM

## 2014-04-17 MED ORDER — GABAPENTIN 800 MG PO TABS: 800.0000 mg | ORAL_TABLET | Freq: Three times a day (TID) | ORAL | Status: DC

## 2014-04-17 MED ORDER — TRAMADOL HCL 50 MG PO TABS: 50.0000 mg | ORAL_TABLET | Freq: Four times a day (QID) | ORAL | Status: DC | PRN

## 2014-04-17 MED ORDER — TRAMADOL HCL 50 MG PO TABS
50.0000 mg | ORAL_TABLET | Freq: Four times a day (QID) | ORAL | Status: DC | PRN
Start: 2014-04-17 — End: 2014-04-28

## 2014-04-17 NOTE — Telephone Encounter (Signed)
Called patient no answered i lvm asking the patient ot return my call. I also called the other contact budd no one answered. Once the patient returns my call i will inform her that Dr. Pearlean BrownieSethi has sent orders to her primary for her to have B 12 injections once a week . She needs to call and set up those dates.

## 2014-04-17 NOTE — Patient Instructions (Signed)
I had a long discussion with the patient with regards to her gait ataxia and hand and feet paresthesias and discussed neuropathy and vitamin B12 deficiency and its relationship. I counseled her to get vitamin B12 supplementation and increase gabapentin to 800 mg 3 times daily for paresthesias as well as use tramadol 50 mg at night when necessary. Check EMG nerve conduction studies, MRI cervical spine and vitamin B12 levels, methylmalonic acid level, Hb A1c and TSH. I also advised her to use her walker at all times to avoid falls and injury. Return for followup in 2 months or earlier if necessary.  Vitamin B12 Deficiency Not having enough vitamin B12 is called a deficiency. Vitamin B12 is an important vitamin. Your body needs vitamin B12 to:   Make red blood cells.  Make DNA. This is the genetic material inside all of your cells.  Help your nerves work properly so they can carry messages from your brain to your body. CAUSES  Not eating enough foods that contain vitamin B12.  Not having enough stomach acid and digestive juices. The body needs these to absorb vitamin B12 from the food you eat.  Having certain digestive system diseases that make it hard to absorb vitamin B12. These diseases include Crohn's disease, chronic pancreatitis, and cystic fibrosis.  Having pernicious anemia, which is a condition where the body has too few red blood cells. People with this condition do not make enough of a protein called "intrinsic factor," which is needed to absorb vitamin B12.  Having a surgery in which part of the stomach or small intestine is removed.  Taking certain medicines that make it hard for the body to absorb vitamin B12. These medicines include:  Heartburn medicine (antacids and proton pump inhibitors).  A certain antibiotic medicine called neomycin, which fights infection.  Some medicines used to treat diabetes, tuberculosis, gout, and high cholesterol. RISK FACTORS Risk factors are  things that make you more likely to develop a vitamin B12 deficiency. They include:  Being older than 50.  Being a vegetarian.  Being pregnant and a vegetarian or having a poor diet.  Taking certain drugs.  Being an alcoholic. SYMPTOMS You may have a vitamin B12 deficiency with no symptoms. However, a vitamin B12 deficiency can cause health problems like anemia and nerve damage. These health problems can lead to many possible symptoms, including:  Weakness.  Fatigue.  Loss of appetite.  Weight loss.  Numbness or tingling in your hands and feet.  Redness and burning of the tongue.  Confusion or memory problems.  Depression.  Dizziness.  Sensory problems, such as loss of taste, color blindness, and ringing in the ears.  Diarrhea or constipation.  Trouble walking. DIAGNOSIS Various types of tests can be given to help find the cause of your vitamin B12 deficiency. These tests include:  A complete blood count (CBC). This test gives your caregiver an overall picture of what makes up your blood.  A blood test to measure your B12 level.  A blood test to measure intrinsic factor.  An endoscopy. This procedure uses a thin tube with a camera on the end to look into your stomach or intestines. TREATMENT Treatment for vitamin B12 deficiency depends on what is causing it. Common options include:  Changing your eating and drinking habits, such as:  Eating more foods that contain vitamin B12.  Not drinking as much alcohol or any alcohol.  Taking vitamin B12 supplements. Your caregiver will tell you what dose is best for you.  Getting vitamin B12 injections. Some people get these a few times a week. Others get them once a month. HOME CARE INSTRUCTIONS  Take all supplements as directed by your caregiver. Follow the directions carefully.  Get any injections your caregiver prescribes. Do not miss your appointments.  Eat lots of healthy foods that contain vitamin B12. Ask  your caregiver if you should work with a nutritionist. Good things to include in your diet are:  Meat.  Poultry.  Fish.  Eggs.  Fortified cereal and dairy products. This means vitamin B12 has been added to the food. Check the label on the package to be sure.  Do not abuse alcohol.  Keep all follow-up appointments. Your caregiver will need to perform blood tests to make sure your vitamin B12 deficiency is going away. SEEK MEDICAL CARE IF:  You have any questions about your treatment.  Your symptoms come back. MAKE SURE YOU:  Understand these instructions.  Will watch your condition.  Will get help right away if you are not doing well or get worse. Document Released: 02/23/2012 Document Reviewed: 02/23/2012 [facility] Patient Information 2014 [place].

## 2014-04-17 NOTE — Telephone Encounter (Signed)
All received from consulting neurologist who agrees that she has peripheral neuropathy felt to be from B12 deficiency.at least  She's been taking oral B12 and he advised  Injectable.because of her presentation.   He increased her Neurontin to 800 3 times a day and given some Ultram at night because of her pain.  Asked him to tell the patient to come in for weekly B12 injections 1000 mcg  and then pending more evaluation plan a followup visit. Plan to do these injections for least 12 weeks and then check level.  Reviewed record : MMa and ifa were never completed although ordered after our January visit .  Reviewed record 5/ 2012 when last seen until reestablishing earlier this year  this year related that she was giving her own injections of B12.   Misty please be aware we can begin week b 12 injections for 12 weeks   Plan rov in 11-12 weeks or sooner as possible.

## 2014-04-17 NOTE — Progress Notes (Signed)
[facility] [address] [place] [address] [phone]       OFFICE CONSULT NOTE  [name] Date of Birth:  October 25, 1951 Medical Record Number:  [id number]   Referring MD:  Adriana Figueroa  Reason for Referral:  Gait difficulty  HPI: 33 year Asian [place] origin lady is a poor historian. She is provided slightly different history in the past when admitted to the hospital in November 2014 seen by Dr Adriana Figueroa as well as when seen by neurologist Dr. Tobie Figueroa in February  2015  chiefly with difficulty walking since last fall and was hospitalized to [facility] and found to have low potassium of 2.8, UTI . She was recently found to have   a low vitamin B12 level of 169 on 12/19/2013.Marland Kitchen She was not compliant with the therapy for B 12   supplementation with Dr. Fabian Figueroa.. She did not want injections and has been taking B12 tablets. She was referred to see Dr. Allena Figueroa neurologist at [facility] neurolog[facility] on 01/24/14 but did not have a good visit. The patient was described as being quite agitated, uncooperative and refused to complete exam and evaluation and treatment recommendations hence were incomplete. She described to Dr. Allena Figueroa that she cannot feel anything anywhere but today she tells me that she has increased sensitivity in both hands as well as in feet from ankle down. She was wheelchair-bound last fall but now states she is able to walk with a wheeled walker since December. She can still fall easily and has to walk slowly. She is a vegetarian in her diet and does not eat any good sources of vitamin B12. She has had underlying psychiatric problems and apparently has not been compliant with her medications or followup with the psychiatrist either. She is still quite tearful about her divorce after 35 years of marriage and not having any social support. She had MRI scan of the lumbar spine showing broad-based foraminal and extraforaminal disc protrusions on the left at L3-4  and L4-5 with only mild foraminal narrowing which were actually unchanged compared to prior study in 2011. The patient's symptoms apparently had started in 2011 without warning and her husband suddenly left the 34 years of marriage hence conversion disorder and depressed mood were felt to be responsible for her symptoms. She denies any memory loss, cognitive difficulties, but bladder bowel incontinence  ROS:   14 system review of systems is positive for fatigue, leg swelling, itching, urination problems, cough, blurred vision, any may, bruising and bleeding, feeling cold, joint pain, cramps, aching muscles, runny nose, skin sensitivity, memory loss, numbness, weakness, slurred speech, dizziness, depression, not enough sleep, decreased energy, change in appetite, insomnia and restless legs.  PMH:  Past Medical History  Diagnosis Date  . GERD (gastroesophageal reflux disease)     on nexium since 1989 ? had esoph ulceration reported egd not in record  . History of esophageal ulcer   . Hyperlipidemia   . Osteoporosis     by dexa in the past failed orals has GERD last dexa 4/11 -1.4, -1.5 relast Jan 2010  . Fracture     hx in the past  . 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 Primiparous   . DJD (degenerative joint disease)     MRI spine DJD 2011  ack surgery consult.  . Depression     [facility]  . Sleep disorder     decreased  . B12 deficiency  gives her own shots  . Fibromyalgia     ? who made this dx. ? rheum?  Marland Kitchen. Arthritis   . Dislocated shoulder     2008, Constant shoulder pain   . Hypothyroidism     Social History:  History   Social History  . Marital Status: Divorced    Spouse Name: N/A    Number of Children: N/A  . Years of Education: BS and MBA   Occupational History  . Not on file.   Social History Main Topics  . Smoking status: Never Smoker   . Smokeless tobacco: Not on file  . Alcohol Use: Yes     Comment: OCCASIONAL  . Drug Use: No   . Sexual Activity: Not on file   Other Topics Concern  . 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    Medications:   Current Outpatient Prescriptions on File Prior to Visit  Medication Sig Dispense Refill  . Calcium Citrate-Vitamin D (CITRACAL + D PO) Take 1 tablet by mouth daily.      . celecoxib (CELEBREX) 200 MG capsule Take 200 mg by mouth daily.      Marland Kitchen. FOLIC ACID PO Take 1 tablet by mouth daily.      Marland Kitchen. MAGNESIUM PO Take 1 tablet by mouth daily.      . Multiple Vitamin (MULTIVITAMIN WITH MINERALS) TABS tablet Take 1 tablet by mouth daily.      . Omega-3 Fatty Acids (FISH OIL PO) Take 1 capsule by mouth daily.      . traZODone (DESYREL) 50 MG tablet Take 25-50 mg by mouth at bedtime as needed for sleep.       No current facility-administered medications on file prior to visit.    Allergies:   Allergies  Allergen Reactions  . Penicillins Other (See Comments)    unknown  . Vicodin [Hydrocodone-Acetaminophen] Nausea And Vomiting    GI upset    Physical Exam General: frail middle aged Asian [place] lady, seated, in no evident distress Head: head normocephalic and atraumatic. Oroparynx benign Neck: supple with no carotid or supraclavicular bruits Cardiovascular: regular rate and rhythm, no murmurs Musculoskeletal: no deformity Skin:  no rash/petichiae Vascular:  Normal pulses all extremities Filed Vitals:   04/17/14 1426  BP: 117/73  Pulse: 70    Neurologic Exam Mental Status: Awake and fully alert. Oriented to place and time. Recent and remote memory intact. Attention span, concentration and fund of knowledge appropriate. Mood and affect labile.  Cranial Nerves:  Fundoscopic exam reveals Figueroa disc margins. Pupils equal, briskly reactive to light. Extraocular movements full without nystagmus. Visual fields full to confrontation. Hearing intact. Facial sensation intact. Face, tongue, palate moves normally and symmetrically.  Motor: mild decreased bulk and tone throughout. Mild generalized decrease strength in all tested extremity muscles but cooperation is limited. Sensory.:hyperesthesia to touch and pinprick in both hands and legs from knee down symmetrically and diminished vibratory sensation over ankles and toes bilaterally. Position sense impaired over toes bilaterally . Rhomberg`s sign positive.  Coordination: Rapid alternating movements normal in all extremities. Finger-to-nose and heel-to-shin performed accurately bilaterally.  Gait and Station: Arises from chair with  difficulty. Stance is broad based. Gait demonstrates wide base, ataxia and imbalance . Uses a wheeled walker Reflexes: 2+ and symmetric in upper extremities and depressed at knees and ankles. Toes downgoing.       ASSESSMENT: [age] year Asian [place] lady with a history of progressive paresthesias, gait ataxia likely related to B12 deficiency and underlying peripheral neuropathy. Unreliable history, patient noncompliance with treatment and underlying psychosocial issues complicate treatment plan    PLAN: I had a long discussion with the patient with regards to her gait ataxia and hand and feet paresthesias and discussed neuropathy and vitamin B12 deficiency and its relationship. I counseled her to get vitamin B12 parenteral  supplementation and increase gabapentin to 800 mg 3 times daily for paresthesias as well as use tramadol 50 mg at night when necessary. Check EMG nerve conduction studies, MRI cervical spine and vitamin B12 levels, methylmalonic acid level, Hb A1c and TSH. I personally spoke to patient's primary physician Dr. Fabian SharpPanosh and recommended she get weekly injections of vitamin B12  to supplement her  B 12 stores followed by oral supplementation. I also advised her to use her walker at all times to avoid falls and injury. Return for followup in 2 months or earlier if necessary.  Note: This document was prepared with digital dictation and possible smart phrase technology. Any transcriptional errors that result from this process are unintentional.

## 2014-04-18 ENCOUNTER — Telehealth: Payer: Self-pay | Admitting: Neurology

## 2014-04-18 NOTE — Telephone Encounter (Signed)
Returning Adriana Figueroa's call

## 2014-04-18 NOTE — Telephone Encounter (Signed)
Patient called back i advised her to get in touch with her Primary to have B12 shots done once a week. I also let her know some one from [facility] will be getting in touch with her to schedule nerve conduction EMG, patient showed understanding and will call her primary.

## 2014-04-19 NOTE — Telephone Encounter (Signed)
Dr. Fabian SharpPanosh noticed that the pt made an injection appt to be done at pulmonary.  Is this correct?  Should she be on my schedule?

## 2014-04-20 ENCOUNTER — Ambulatory Visit (INDEPENDENT_AMBULATORY_CARE_PROVIDER_SITE_OTHER): Payer: 59 | Admitting: *Deleted

## 2014-04-20 ENCOUNTER — Telehealth: Payer: Self-pay | Admitting: *Deleted

## 2014-04-20 DIAGNOSIS — D518 Other vitamin B12 deficiency anemias: Secondary | ICD-10-CM

## 2014-04-20 LAB — HEMOGLOBIN A1C
Est. average glucose Bld gHb Est-mCnc: 114 mg/dL
Hgb A1c MFr Bld: 5.6 % (ref 4.8–5.6)

## 2014-04-20 LAB — METHYLMALONIC ACID, SERUM: Methylmalonic Acid: 120 nmol/L (ref 0–378)

## 2014-04-20 LAB — TSH: TSH: 0.761 u[IU]/mL (ref 0.450–4.500)

## 2014-04-20 LAB — VITAMIN B12: VITAMIN B 12: 719 pg/mL (ref 211–946)

## 2014-04-20 MED ORDER — CYANOCOBALAMIN 1000 MCG/ML IJ SOLN
1000.0000 ug | Freq: Once | INTRAMUSCULAR | Status: AC
  Administered 2014-04-20: 1000 ug via INTRAMUSCULAR

## 2014-04-20 NOTE — Telephone Encounter (Signed)
Patient also wants results from [facility] and questioning when NCV/EMG appt would be scheduled.  Did not see an order for NCV/EMG.

## 2014-04-20 NOTE — Telephone Encounter (Signed)
Patient calling questioning how much longer will she have to do the b12 injections.  Please call and advise.

## 2014-04-20 NOTE — Telephone Encounter (Signed)
Pt said they told her to come here will call and verify

## 2014-04-21 NOTE — Telephone Encounter (Signed)
Informed patient per Dr Teofilo PodAthar's note of normal labs, she verbalized understanding

## 2014-04-21 NOTE — Telephone Encounter (Signed)
Gave referral for scheduling to call for the NCV/EMG. Patient is  wanting to know how long she will have to take the B12 injections and would like to know the results of the lab work from 04/27/14

## 2014-04-21 NOTE — Telephone Encounter (Signed)
Labs checked on 04/17/14 were in normal range.

## 2014-04-28 ENCOUNTER — Ambulatory Visit: Payer: 59 | Admitting: Family Medicine

## 2014-04-28 ENCOUNTER — Ambulatory Visit (INDEPENDENT_AMBULATORY_CARE_PROVIDER_SITE_OTHER): Payer: 59 | Admitting: Internal Medicine

## 2014-04-28 ENCOUNTER — Telehealth: Payer: Self-pay | Admitting: Internal Medicine

## 2014-04-28 ENCOUNTER — Encounter: Payer: Self-pay | Admitting: Internal Medicine

## 2014-04-28 ENCOUNTER — Encounter: Payer: 59 | Admitting: Internal Medicine

## 2014-04-28 VITALS — BP 116/80 | Temp 98.1°F

## 2014-04-28 DIAGNOSIS — K219 Gastro-esophageal reflux disease without esophagitis: Secondary | ICD-10-CM

## 2014-04-28 DIAGNOSIS — R29898 Other symptoms and signs involving the musculoskeletal system: Secondary | ICD-10-CM

## 2014-04-28 DIAGNOSIS — R1013 Epigastric pain: Secondary | ICD-10-CM

## 2014-04-28 DIAGNOSIS — R636 Underweight: Secondary | ICD-10-CM

## 2014-04-28 DIAGNOSIS — E538 Deficiency of other specified B group vitamins: Secondary | ICD-10-CM | POA: Insufficient documentation

## 2014-04-28 MED ORDER — PANTOPRAZOLE SODIUM 40 MG PO TBEC: 40.0000 mg | DELAYED_RELEASE_TABLET | Freq: Every day | ORAL | Status: DC

## 2014-04-28 MED ORDER — CYANOCOBALAMIN 1000 MCG/ML IJ SOLN
1000.0000 ug | Freq: Once | INTRAMUSCULAR | Status: AC
  Administered 2014-04-28: 1000 ug via INTRAMUSCULAR

## 2014-04-28 NOTE — Patient Instructions (Signed)
Begin protonox generic . Every day . ROV in 1 month when due for  A b12 shot at the same time .

## 2014-04-28 NOTE — Progress Notes (Signed)
Pre visit review using our clinic review tool, if applicable. No additional management support is needed unless otherwise documented below in the visit note.   Chief Complaint  Patient presents with  . Follow-up    Needs b12 injection.  Complains of constant acid reflux.    HPI: Patient comes in to discuss her GI situation. She complains of acid burning in the high epigastrium and abdomen. She's had a problem with this for years and was reportedly had esophageal ulceration treated with Nexium. We don't have a copy of endoscopy. She's been off the medicine for a while some concerns of side effects for her bones. Has been recently taking Pepcid as needed but this is not an op notes been uncomfortable for her. No dysphagia or vomiting weight is under weight but no massive weight loss i. In past few weeks. Pain as bad after eating  Not in  morning pepcid . Doesn't completely help it She is under evaluation neurologically has B12 deficiency felt to have neuropathy from fat but far from clear cut what is going on however she is not letting herself be in a wheelchair will walk with a cane short small steps at home has not had a fall. Recently. Does the physical therapy exercises in her home. Has numbness in her hands and feet. She is getting weekly B12 injections for 12 and then a followup. Uncertain whether she has autoimmune or pernicious anemia.  ROS: See pertinent positives and negatives per HPI.  Past Medical History  Diagnosis Date  . GERD (gastroesophageal reflux disease)     on nexium since 1989 ? had esoph ulceration reported egd not in record  . History of esophageal ulcer   . Hyperlipidemia   . Osteoporosis     by dexa in the past failed orals has GERD last dexa 4/11 -1.4, -1.5 relast Jan 2010  . Fracture     hx in the past  . 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 Primiparous   . DJD (degenerative joint disease)     MRI spine DJD 2011  ack surgery consult.  . Depression     decrease sleep,  [facility]  . Sleep disorder     decreased  . B12 deficiency     gives her own shots  . Fibromyalgia     ? who made this dx. ? rheum?  Marland Kitchen. Arthritis   . Dislocated shoulder     2008, Constant shoulder pain   . Hypothyroidism     Family History  Problem Relation Age of Onset  . Hypertension Mother   . Osteoporosis Mother   . Kidney failure Mother   . Aneurysm Father     Brain    History   Social History  . Marital Status: Divorced    Spouse Name: N/A    Number of Children: N/A  . Years of Education: BS and MBA   Social History Main Topics  . Smoking status: Never Smoker   . Smokeless tobacco: Not on file  . Alcohol Use: Yes     Comment: OCCASIONAL  . Drug Use: No  . Sexual Activity: Not on file   Other Topics Concern  . 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    Outpatient Encounter Prescriptions as of 04/28/2014  Medication Sig  . Calcium Citrate-Vitamin D (CITRACAL + D PO) Take 1 tablet by mouth daily.  . celecoxib (CELEBREX) 200 MG capsule Take 200 mg by mouth daily.  Marland Kitchen. FOLIC ACID PO Take 1 tablet by mouth daily.  Marland Kitchen. gabapentin (NEURONTIN) 800 MG tablet Take 1 tablet (800 mg total) by mouth 3 (three) times daily.  Marland Kitchen. MAGNESIUM PO Take 1 tablet by mouth daily.  . Multiple Vitamin (MULTIVITAMIN WITH MINERALS) TABS tablet Take 1 tablet by mouth daily.  . Omega-3 Fatty Acids (FISH OIL PO) Take 1 capsule by mouth daily.  . pantoprazole (PROTONIX) 40 MG tablet Take 1 tablet (40 mg total) by mouth daily.  . traMADol (ULTRAM) 50 MG tablet Take 1 tablet (50 mg total) by mouth every 6 (six) hours as needed for severe pain.  . traZODone (DESYREL)  50 MG tablet Take 25-50 mg by mouth at bedtime as needed for sleep.  . [DISCONTINUED] traMADol (ULTRAM) 50 MG tablet Take 1 tablet (50 mg total) by mouth every 6 (six) hours as needed.  . [EXPIRED] cyanocobalamin ((VITAMIN B-12)) injection 1,000 mcg     EXAM:  BP 116/80  Temp(Src) 98.1 F (36.7 C) (Oral)  Cannot calculate BMI with a height equal to zero.  GENERAL: vitals reviewed and listed above, alert, oriented, appears well hydrated and in no acute distress she is slender pleasant with normal speech independent walks slowly with a cane short steps. Cannot get up on the table examined while standing and sitting HEENT: atraumatic, conjunctiva  clear, no obvious abnormalities on inspection of external nose and ears OP : no lesion edema or exudate  NECK: no obvious masses on inspection palpation  LUNGS: clear to auscultation bilaterally, no wheezes, rales or rhonchi, good air movement CV: HRRR, no clubbing cyanosis or  peripheral edema nl cap refill  Abdomen no obvious masses while standing points to high epigastrium as area of discomfort MS: moves all extremities decreased muscle mass no obvious tremor PSYCH: pleasant and cooperative, no obvious depression or anxiety Wt Readings from Last 3 Encounters:  04/17/14 102 lb 8 oz (46.494 kg)  10/15/13 102 lb 1.2 oz (46.3 kg)  04/24/12 110 lb (49.896 kg)    ASSESSMENT AND PLAN:  Discussed the following assessment and plan:  Postprandial epigastric pain - consider endoscopy pt wants to observe for now   B12 deficiency - Plan: cyanocobalamin ((VITAMIN B-12)) injection 1,000 mcg  GERD  Leg weakness, bilateral - under evaluation  Underweight Disc keep[oing cell phone on body  Fall avoidance continue exercises as possible kee neuro appts also  Add protonic consider endoscopy ( she is also due for colonoscopy) -Patient advised to return or notify health care team  if symptoms worsen ,persist or new concerns arise.  Patient Instructions   Begin protonox generic . Every day . ROV in 1 month when due for  A b12 shot at the same time .      Neta MendsWanda K. Panosh M.D.

## 2014-04-28 NOTE — Progress Notes (Signed)
Document opened and reviewed for OV but appt  canceled same day . But seen in  See other doucment

## 2014-04-28 NOTE — Telephone Encounter (Signed)
Please have her bring this up when she comes in for her ROV in a month and we can discuss this medication.   prolia is a good medication for osteoporosis  And we could prescribe this but i dont know what that has to do with OTC  Should still get enough vit d and calcium

## 2014-04-28 NOTE — Telephone Encounter (Signed)
Pt would like to know if Dr Fabian SharpPanosh would start giving her a PROLIA injection so that she don't have to take over the counter meds

## 2014-05-01 NOTE — Telephone Encounter (Signed)
Patient notified this will be discussed at her next OV.

## 2014-05-05 ENCOUNTER — Ambulatory Visit (INDEPENDENT_AMBULATORY_CARE_PROVIDER_SITE_OTHER): Payer: 59 | Admitting: Family Medicine

## 2014-05-05 DIAGNOSIS — D518 Other vitamin B12 deficiency anemias: Secondary | ICD-10-CM

## 2014-05-05 DIAGNOSIS — D519 Vitamin B12 deficiency anemia, unspecified: Secondary | ICD-10-CM

## 2014-05-05 MED ORDER — CYANOCOBALAMIN 1000 MCG/ML IJ SOLN
1000.0000 ug | Freq: Once | INTRAMUSCULAR | Status: AC
  Administered 2014-05-05: 1000 ug via INTRAMUSCULAR

## 2014-05-09 ENCOUNTER — Encounter (INDEPENDENT_AMBULATORY_CARE_PROVIDER_SITE_OTHER): Payer: Self-pay

## 2014-05-09 ENCOUNTER — Encounter (INDEPENDENT_AMBULATORY_CARE_PROVIDER_SITE_OTHER): Payer: Self-pay | Admitting: Radiology

## 2014-05-09 ENCOUNTER — Telehealth: Payer: Self-pay | Admitting: Neurology

## 2014-05-09 ENCOUNTER — Ambulatory Visit (INDEPENDENT_AMBULATORY_CARE_PROVIDER_SITE_OTHER): Payer: 59 | Admitting: Neurology

## 2014-05-09 DIAGNOSIS — Z0289 Encounter for other administrative examinations: Secondary | ICD-10-CM

## 2014-05-09 DIAGNOSIS — G63 Polyneuropathy in diseases classified elsewhere: Secondary | ICD-10-CM

## 2014-05-09 DIAGNOSIS — G609 Hereditary and idiopathic neuropathy, unspecified: Secondary | ICD-10-CM

## 2014-05-09 DIAGNOSIS — R269 Unspecified abnormalities of gait and mobility: Secondary | ICD-10-CM

## 2014-05-09 DIAGNOSIS — E538 Deficiency of other specified B group vitamins: Secondary | ICD-10-CM

## 2014-05-09 NOTE — Telephone Encounter (Signed)
Certified dismissal letter returned as undeliverable, unclaimed, return to sender after three attempts by [employer]. Letter placed in another envelope and resent as 1st class mail which does not require a signature. DAJ 05/09/14

## 2014-05-09 NOTE — Procedures (Signed)
     HISTORY:  Adriana Figueroa is a 63 year old patient with a history of paresthesias all 4 extremities that began in October 2014. The patient has a sensory ataxia associated with this, currently using a walker for ambulation. The patient is being evaluated for possible neuropathy. She has had a documented low vitamin B12 level.  NERVE CONDUCTION STUDIES:  Nerve conduction studies were performed on the left upper extremity. The distal motor latencies and motor amplitudes for the median and ulnar nerves were within normal limits. The F wave latencies and nerve conduction velocities for these nerves were also normal. The sensory latencies for the median and ulnar nerves were normal. The left radial sensory latency was slightly prolonged.  Nerve conduction studies were performed on both lower extremities. The distal motor latencies for the peroneal and posterior tibial nerves were normal bilaterally. The motor amplitude for the right peroneal nerve was low, but the motor amplitudes for the left peroneal nerve and for the posterior tibial nerves were normal bilaterally. The nerve conduction velocities for the peroneal and posterior tibial nerves were normal bilaterally. The F wave latencies for the right peroneal nerve was absent, but the F wave latencies for the left peroneal nerve and for the posterior tibial nerves were normal bilaterally. The peroneal sensory latencies were unobtainable bilaterally.  EMG STUDIES:  EMG study was performed on the right lower extremity:  The tibialis anterior muscle reveals 2 to 4K motor units with full recruitment. No fibrillations or positive waves were seen. The peroneus tertius muscle reveals 2 to 5K motor units with decreased recruitment. No fibrillations or positive waves were seen. The medial gastrocnemius muscle reveals 1 to 3K motor units with full recruitment. No fibrillations or positive waves were seen. The vastus lateralis muscle reveals 2 to 4K motor  units with full recruitment. No fibrillations or positive waves were seen. The iliopsoas muscle reveals 2 to 4K motor units with full recruitment. No fibrillations or positive waves were seen. The biceps femoris muscle (long head) reveals 2 to 4K motor units with full recruitment. No fibrillations or positive waves were seen. The lumbosacral paraspinal muscles were tested at 3 levels, and revealed no abnormalities of insertional activity at all 3 levels tested. There was good relaxation.   IMPRESSION:  Nerve conduction studies done on the left upper extremity and both lower extremities shows evidence of a mild, primarily sensory peripheral neuropathy. EMG evaluation of the right lower extremity does show some mild distal chronic signs of denervation consistent with a peripheral neuropathy, without evidence of an overlying lumbosacral radiculopathy.  Marlan Palau MD 05/09/2014 1:59 PM  [facility] [address] [place], [place] [address]  Phone [phone] Fax [phone]

## 2014-05-12 ENCOUNTER — Ambulatory Visit (INDEPENDENT_AMBULATORY_CARE_PROVIDER_SITE_OTHER): Payer: 59 | Admitting: Family Medicine

## 2014-05-12 DIAGNOSIS — E538 Deficiency of other specified B group vitamins: Secondary | ICD-10-CM

## 2014-05-12 MED ORDER — CYANOCOBALAMIN 1000 MCG/ML IJ SOLN
1000.0000 ug | Freq: Once | INTRAMUSCULAR | Status: AC
  Administered 2014-05-12: 1000 ug via INTRAMUSCULAR

## 2014-05-13 ENCOUNTER — Encounter: Payer: Self-pay | Admitting: Internal Medicine

## 2014-05-13 DIAGNOSIS — G629 Polyneuropathy, unspecified: Secondary | ICD-10-CM | POA: Insufficient documentation

## 2014-05-19 ENCOUNTER — Ambulatory Visit (INDEPENDENT_AMBULATORY_CARE_PROVIDER_SITE_OTHER): Payer: 59 | Admitting: Family Medicine

## 2014-05-19 DIAGNOSIS — D518 Other vitamin B12 deficiency anemias: Secondary | ICD-10-CM

## 2014-05-19 DIAGNOSIS — D519 Vitamin B12 deficiency anemia, unspecified: Secondary | ICD-10-CM

## 2014-05-19 MED ORDER — CYANOCOBALAMIN 1000 MCG/ML IJ SOLN
1000.0000 ug | Freq: Once | INTRAMUSCULAR | Status: AC
  Administered 2014-05-19: 1000 ug via INTRAMUSCULAR

## 2014-05-26 ENCOUNTER — Ambulatory Visit: Payer: 59 | Admitting: Family Medicine

## 2014-05-26 ENCOUNTER — Ambulatory Visit (INDEPENDENT_AMBULATORY_CARE_PROVIDER_SITE_OTHER): Payer: 59 | Admitting: Internal Medicine

## 2014-05-26 ENCOUNTER — Encounter: Payer: Self-pay | Admitting: Internal Medicine

## 2014-05-26 VITALS — BP 124/82 | Temp 97.7°F | Ht 61.0 in | Wt 111.0 lb

## 2014-05-26 DIAGNOSIS — D518 Other vitamin B12 deficiency anemias: Secondary | ICD-10-CM

## 2014-05-26 DIAGNOSIS — M810 Age-related osteoporosis without current pathological fracture: Secondary | ICD-10-CM

## 2014-05-26 DIAGNOSIS — N951 Menopausal and female climacteric states: Secondary | ICD-10-CM

## 2014-05-26 DIAGNOSIS — R29898 Other symptoms and signs involving the musculoskeletal system: Secondary | ICD-10-CM

## 2014-05-26 DIAGNOSIS — D519 Vitamin B12 deficiency anemia, unspecified: Secondary | ICD-10-CM | POA: Insufficient documentation

## 2014-05-26 DIAGNOSIS — K219 Gastro-esophageal reflux disease without esophagitis: Secondary | ICD-10-CM

## 2014-05-26 MED ORDER — CYANOCOBALAMIN 1000 MCG/ML IJ SOLN
1000.0000 ug | Freq: Once | INTRAMUSCULAR | Status: AC
  Administered 2014-05-26: 1000 ug via INTRAMUSCULAR
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
## 2014-05-26 NOTE — Patient Instructions (Addendum)
Continue on protonix   For now.   Will follow for now Get dexa scan. And then plan on beginning prolia.  We have to send in for approval.  Continue vit d .  ? Hot flushes . Stay on vit b12 .  Ok to use voltaren gel. For knees.  Stay on gabapentin .  Glad you are doing some better .  ROV   In 3 months .  Your last thyroid test was normal in May.

## 2014-05-29 ENCOUNTER — Ambulatory Visit: Payer: Self-pay | Admitting: Family Medicine

## 2014-06-02 ENCOUNTER — Ambulatory Visit (INDEPENDENT_AMBULATORY_CARE_PROVIDER_SITE_OTHER): Payer: 59 | Admitting: Family Medicine

## 2014-06-02 DIAGNOSIS — D518 Other vitamin B12 deficiency anemias: Secondary | ICD-10-CM

## 2014-06-02 DIAGNOSIS — D519 Vitamin B12 deficiency anemia, unspecified: Secondary | ICD-10-CM

## 2014-06-02 MED ORDER — CYANOCOBALAMIN 1000 MCG/ML IJ SOLN
1000.0000 ug | Freq: Once | INTRAMUSCULAR | Status: AC
  Administered 2014-06-02: 1000 ug via INTRAMUSCULAR

## 2014-06-05 ENCOUNTER — Ambulatory Visit (INDEPENDENT_AMBULATORY_CARE_PROVIDER_SITE_OTHER)
Admission: RE | Admit: 2014-06-05 | Discharge: 2014-06-05 | Disposition: A | Discharge status: Home / Self Care | Payer: 59 | Source: Ambulatory Visit | Attending: Internal Medicine | Admitting: Internal Medicine

## 2014-06-05 DIAGNOSIS — M810 Age-related osteoporosis without current pathological fracture: Secondary | ICD-10-CM

## 2014-06-07 ENCOUNTER — Other Ambulatory Visit: Payer: Self-pay | Admitting: Family Medicine

## 2014-06-07 ENCOUNTER — Telehealth: Payer: Self-pay | Admitting: Neurology

## 2014-06-07 ENCOUNTER — Encounter: Payer: Self-pay | Admitting: Internal Medicine

## 2014-06-07 ENCOUNTER — Telehealth: Payer: Self-pay | Admitting: Internal Medicine

## 2014-06-07 ENCOUNTER — Ambulatory Visit (INDEPENDENT_AMBULATORY_CARE_PROVIDER_SITE_OTHER): Payer: 59 | Admitting: Internal Medicine

## 2014-06-07 VITALS — BP 150/84 | Temp 98.1°F

## 2014-06-07 DIAGNOSIS — M79609 Pain in unspecified limb: Secondary | ICD-10-CM

## 2014-06-07 DIAGNOSIS — R232 Flushing: Secondary | ICD-10-CM

## 2014-06-07 DIAGNOSIS — H6990 Unspecified Eustachian tube disorder, unspecified ear: Secondary | ICD-10-CM

## 2014-06-07 DIAGNOSIS — Z79890 Hormone replacement therapy (postmenopausal): Secondary | ICD-10-CM

## 2014-06-07 DIAGNOSIS — M7989 Other specified soft tissue disorders: Secondary | ICD-10-CM

## 2014-06-07 DIAGNOSIS — N951 Menopausal and female climacteric states: Secondary | ICD-10-CM

## 2014-06-07 DIAGNOSIS — H6983 Other specified disorders of Eustachian tube, bilateral: Secondary | ICD-10-CM

## 2014-06-07 DIAGNOSIS — H6980 Other specified disorders of Eustachian tube, unspecified ear: Secondary | ICD-10-CM

## 2014-06-07 DIAGNOSIS — M79669 Pain in unspecified lower leg: Secondary | ICD-10-CM | POA: Insufficient documentation

## 2014-06-07 MED ORDER — TRIAMTERENE-HCTZ 37.5-25 MG PO TABS: 0.5000 | ORAL_TABLET | Freq: Every day | ORAL | Status: DC

## 2014-06-07 NOTE — Telephone Encounter (Signed)
It appears this medication was originally prescribed in May.  I called the patient back on cell, got no answer.  VM has not been set up, unable to LM.  I called home number, got no answer.  As well, the recording on this line said VM has not been set up, unable to leave message.

## 2014-06-07 NOTE — Telephone Encounter (Signed)
I called again.  Got no answer on either line, no VM.

## 2014-06-07 NOTE — Telephone Encounter (Signed)
Pt called states her medication gabapentin (NEURONTIN) 800 MG tablet, is causing her legs and feet to swell. Pt would like a nurse to call her back concerning this matter. Thanks

## 2014-06-07 NOTE — Telephone Encounter (Signed)
Spoke to the pt.  She thought that Dr. Fabian SharpPanosh was not going to help her.  She did not give Dr. Fabian SharpPanosh the time to explain herself.  Complete misunderstanding.  Pt is back on the schedule.  Medication has been sent to the pharmacy and will place referral for gynecology.
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
## 2014-06-07 NOTE — Telephone Encounter (Signed)
Pt called and asked me to cancel ALL her upcoming appts.  Pt states she does not even want to come in for her b12 tomorrow, I asked pt 3-4 times if she was really sure she wanted to cancel her appts. I told pt I would miss her and hated to see her go. I said I wish she would change her mind. Pt and I have had a good repore.  Pt stated the doctor just nodded her head at her today and that made her upset. Pt states she doesn't want to waste her time if she cannot get any help.  Pt said she has found another doctor, and then she hung up on me.

## 2014-06-07 NOTE — Patient Instructions (Signed)
This acts like eustachian tube dysfunction and can be caused by colds or anything causing congestion  Such as allergy.  Nasal saline  And gently blow nose . Begin nasacort or flonase and use 2 sprays every day  For at least 2 weeks  If it helps can try off  . If sx return then just stay on the nose spray . I dont see infection at this time. Swelling is prob from the gabapentin and  Diuretics prob wont help  But could try  A  Few days to see . Diuretics can cause low potassium which can be serious and cause leg cramps. You could consider seeing gyne about  other options for  Flushes .

## 2014-06-07 NOTE — Telephone Encounter (Signed)
I called again.  Got no answer on either line, unable to LM.

## 2014-06-08 ENCOUNTER — Ambulatory Visit (INDEPENDENT_AMBULATORY_CARE_PROVIDER_SITE_OTHER): Payer: 59 | Admitting: Family Medicine

## 2014-06-08 DIAGNOSIS — E538 Deficiency of other specified B group vitamins: Secondary | ICD-10-CM

## 2014-06-08 MED ORDER — CYANOCOBALAMIN 1000 MCG/ML IJ SOLN: 1000.0000 ug | Freq: Once | INTRAMUSCULAR | Status: DC

## 2014-06-08 MED ORDER — CYANOCOBALAMIN 1000 MCG/ML IJ SOLN
1000.0000 ug | Freq: Once | INTRAMUSCULAR | Status: AC
  Administered 2014-06-08: 1000 ug via INTRAMUSCULAR

## 2014-06-08 NOTE — Telephone Encounter (Signed)
Patient calling back requesting a return call to [phone].  Having issues with land line and mobile wasn't charged yesterday.

## 2014-06-08 NOTE — Telephone Encounter (Signed)
I tried to call again.  Cell number has a message saying it is not currently in service and home number I get no answer, VM is not accepting messages.  I have not been able to reach this patient.

## 2014-06-08 NOTE — Telephone Encounter (Signed)
I called again.  This line had a recording saying the number has been temporarily disconnected.

## 2014-06-09 ENCOUNTER — Ambulatory Visit: Payer: Self-pay | Admitting: Family Medicine

## 2014-06-09 ENCOUNTER — Telehealth: Payer: Self-pay | Admitting: Neurology

## 2014-06-09 NOTE — Telephone Encounter (Signed)
I called pt on her cell #.  She apologized for her behavior and her language.  Pt has been on increased dose of gabapentin since 04-17-14 800mg  po tid per Dr. Pearlean BrownieSethi recommendation for her neuropathy.  She states that this has not helped her at all.  She has been to see Dr. Fabian SharpPanosh twice this last month.  (most recent 06-07-14).  Pt has concerns of swelling, (gaining weight) side effect of gabapentin.  I relayed this is noted to be a less common side effect but it could be causing problem.  She does not have swelling of lips or tongue.  She does not take any ace inhibitors.  Dr. Fabian SharpPanosh prescribed for her a diuretic which she has not picked up.  I told her that she may stop the gabapentin.  She said she would not at this time and take the diuretic and see how she does over the weekend.  She will call us back first of next week.   I offered to make appt with Heide GuileLynn Lam, NP but she said she would see how she did over the weekend and then call.

## 2014-06-09 NOTE — Telephone Encounter (Signed)
Ask her to keep off neurontin/ gabapentin. PCP to review if she takes any ACE inhibitors that can cause swelling of the lips and face. This is urgent

## 2014-06-09 NOTE — Telephone Encounter (Signed)
Pt called back states to call the [phone], it's her cell phone land line is not working. I just hung up speaking with her and she states the swollen has gotten worse. Please call pt concerning this matter. Thanks

## 2014-06-09 NOTE — Telephone Encounter (Signed)
I called again.  Finally got an answer, but then the line disconnected.  I called back.  Spoke with the patient.   I asked the patient when she first started noticing the swelling.  Her reply was she doesn't keep a calendar and write down everything so she doesn't know.  I asked if she felt it began when she started taking Gabapentin, or after taking it for a while.  Again patient replied saying she doesn't keep a calendar of events, and doesn't know.  Says she has decided to stop taking Gabapentin due to swelling, and refuses to take it again because she does not want to look like an elephant.  Said her PCP prescribed a diuretic to try and help with this issue, but she has not picked it up.   I asked her when she stopped taking the Gabapentin and she told me to stop asking her questions about when she does things.  I asked if she noticed any decrease in swelling since she d/c this med and she said no.  Patient is very agitated and wants something done about her swelling.  Dr Pearlean BrownieSethi is out of the office, forwarding request to [facility] to review.

## 2014-06-09 NOTE — Telephone Encounter (Signed)
Spoke with patient , said that she is no longer going to take the gabapentin, makes her gain weight and does not help,was very angry, used profanity words and hung up

## 2014-06-15 ENCOUNTER — Ambulatory Visit (INDEPENDENT_AMBULATORY_CARE_PROVIDER_SITE_OTHER): Payer: 59 | Admitting: Family Medicine

## 2014-06-15 DIAGNOSIS — D519 Vitamin B12 deficiency anemia, unspecified: Secondary | ICD-10-CM

## 2014-06-15 DIAGNOSIS — D518 Other vitamin B12 deficiency anemias: Secondary | ICD-10-CM

## 2014-06-15 MED ORDER — CYANOCOBALAMIN 1000 MCG/ML IJ SOLN
1000.0000 ug | Freq: Once | INTRAMUSCULAR | Status: AC
  Administered 2014-06-15: 1000 ug via INTRAMUSCULAR

## 2014-06-22 ENCOUNTER — Telehealth: Payer: Self-pay | Admitting: Neurology

## 2014-06-22 NOTE — Telephone Encounter (Signed)
Patient called and stated she needed an appointment before January.  She couldn't give me an exact reason for an earlier appointment.  Please call and advise.

## 2014-06-23 ENCOUNTER — Ambulatory Visit (INDEPENDENT_AMBULATORY_CARE_PROVIDER_SITE_OTHER): Payer: 59 | Admitting: Family Medicine

## 2014-06-23 DIAGNOSIS — D518 Other vitamin B12 deficiency anemias: Secondary | ICD-10-CM

## 2014-06-23 DIAGNOSIS — D519 Vitamin B12 deficiency anemia, unspecified: Secondary | ICD-10-CM

## 2014-06-23 MED ORDER — CYANOCOBALAMIN 1000 MCG/ML IJ SOLN
1000.0000 ug | Freq: Once | INTRAMUSCULAR | Status: AC
  Administered 2014-06-23: 1000 ug via INTRAMUSCULAR

## 2014-06-26 NOTE — Telephone Encounter (Signed)
Called pt to schedule an appt with Dr. Pearlean BrownieSethi on 08/18/14. I advised the pt that if she has any other problems, questions or concerns to call the office. Pt verbalized understanding.

## 2014-06-27 ENCOUNTER — Telehealth: Payer: Self-pay | Admitting: Internal Medicine

## 2014-06-27 NOTE — Telephone Encounter (Signed)
What should I tell the pt about her most recent dexa vs old dexa?

## 2014-06-27 NOTE — Telephone Encounter (Signed)
Pt called to say that she waiting on a call back about osteoporosis test

## 2014-06-27 NOTE — Telephone Encounter (Signed)
Tell patient and can give her a copy also  decrease in bone density reported as -4.4 %  Should proceed with osteoporosis treatment  i suggest  proceeded with the prolia

## 2014-06-28 NOTE — Telephone Encounter (Signed)
Patient notified of results by telephone.  Pt will get Prolia Injection when she comes in for her B12.

## 2014-06-28 NOTE — Telephone Encounter (Signed)
Pt calling for results of bone density. pls call

## 2014-06-30 ENCOUNTER — Ambulatory Visit (INDEPENDENT_AMBULATORY_CARE_PROVIDER_SITE_OTHER): Payer: 59 | Admitting: Family Medicine

## 2014-06-30 DIAGNOSIS — M810 Age-related osteoporosis without current pathological fracture: Secondary | ICD-10-CM

## 2014-06-30 DIAGNOSIS — D518 Other vitamin B12 deficiency anemias: Secondary | ICD-10-CM

## 2014-06-30 DIAGNOSIS — D519 Vitamin B12 deficiency anemia, unspecified: Secondary | ICD-10-CM

## 2014-06-30 MED ORDER — DENOSUMAB 60 MG/ML ~~LOC~~ SOLN
60.0000 mg | Freq: Once | SUBCUTANEOUS | Status: AC
  Administered 2014-06-30: 60 mg via SUBCUTANEOUS

## 2014-06-30 MED ORDER — CYANOCOBALAMIN 1000 MCG/ML IJ SOLN
1000.0000 ug | Freq: Once | INTRAMUSCULAR | Status: AC
  Administered 2014-06-30: 1000 ug via INTRAMUSCULAR

## 2014-07-07 ENCOUNTER — Ambulatory Visit (INDEPENDENT_AMBULATORY_CARE_PROVIDER_SITE_OTHER): Payer: 59 | Admitting: Family Medicine

## 2014-07-07 DIAGNOSIS — D519 Vitamin B12 deficiency anemia, unspecified: Secondary | ICD-10-CM

## 2014-07-07 DIAGNOSIS — D518 Other vitamin B12 deficiency anemias: Secondary | ICD-10-CM

## 2014-07-07 MED ORDER — CYANOCOBALAMIN 1000 MCG/ML IJ SOLN
1000.0000 ug | Freq: Once | INTRAMUSCULAR | Status: AC
  Administered 2014-07-07: 1000 ug via INTRAMUSCULAR

## 2014-07-14 ENCOUNTER — Ambulatory Visit (INDEPENDENT_AMBULATORY_CARE_PROVIDER_SITE_OTHER): Payer: 59 | Admitting: Family Medicine

## 2014-07-14 DIAGNOSIS — D519 Vitamin B12 deficiency anemia, unspecified: Secondary | ICD-10-CM

## 2014-07-14 DIAGNOSIS — D518 Other vitamin B12 deficiency anemias: Secondary | ICD-10-CM

## 2014-07-14 MED ORDER — CYANOCOBALAMIN 1000 MCG/ML IJ SOLN
1000.0000 ug | Freq: Once | INTRAMUSCULAR | Status: AC
  Administered 2014-07-14: 1000 ug via INTRAMUSCULAR

## 2014-07-20 NOTE — Telephone Encounter (Signed)
Noted  

## 2014-07-21 ENCOUNTER — Ambulatory Visit (INDEPENDENT_AMBULATORY_CARE_PROVIDER_SITE_OTHER): Payer: 59 | Admitting: Family Medicine

## 2014-07-21 ENCOUNTER — Other Ambulatory Visit: Payer: Self-pay | Admitting: Family Medicine

## 2014-07-21 DIAGNOSIS — D519 Vitamin B12 deficiency anemia, unspecified: Secondary | ICD-10-CM

## 2014-07-21 DIAGNOSIS — D518 Other vitamin B12 deficiency anemias: Secondary | ICD-10-CM

## 2014-07-21 DIAGNOSIS — Z0000 Encounter for general adult medical examination without abnormal findings: Secondary | ICD-10-CM

## 2014-07-21 MED ORDER — CYANOCOBALAMIN 1000 MCG/ML IJ SOLN
1000.0000 ug | Freq: Once | INTRAMUSCULAR | Status: AC
  Administered 2014-07-21: 1000 ug via INTRAMUSCULAR

## 2014-07-28 ENCOUNTER — Ambulatory Visit: Payer: Self-pay | Admitting: Family Medicine

## 2014-07-28 ENCOUNTER — Ambulatory Visit (INDEPENDENT_AMBULATORY_CARE_PROVIDER_SITE_OTHER): Payer: 59 | Admitting: Family Medicine

## 2014-07-28 DIAGNOSIS — D518 Other vitamin B12 deficiency anemias: Secondary | ICD-10-CM

## 2014-07-28 DIAGNOSIS — D519 Vitamin B12 deficiency anemia, unspecified: Secondary | ICD-10-CM

## 2014-07-28 MED ORDER — CYANOCOBALAMIN 1000 MCG/ML IJ SOLN
1000.0000 ug | Freq: Once | INTRAMUSCULAR | Status: AC
  Administered 2014-07-28: 1000 ug via INTRAMUSCULAR

## 2014-08-02 ENCOUNTER — Ambulatory Visit: Payer: Self-pay | Admitting: Neurology

## 2014-08-03 ENCOUNTER — Ambulatory Visit (INDEPENDENT_AMBULATORY_CARE_PROVIDER_SITE_OTHER): Payer: 59 | Admitting: Family Medicine

## 2014-08-03 DIAGNOSIS — E538 Deficiency of other specified B group vitamins: Secondary | ICD-10-CM

## 2014-08-03 MED ORDER — CYANOCOBALAMIN 1000 MCG/ML IJ SOLN
1000.0000 ug | Freq: Once | INTRAMUSCULAR | Status: AC
  Administered 2014-08-03: 1000 ug via INTRAMUSCULAR

## 2014-08-04 ENCOUNTER — Ambulatory Visit: Payer: Self-pay | Admitting: Family Medicine

## 2014-08-08 ENCOUNTER — Other Ambulatory Visit (INDEPENDENT_AMBULATORY_CARE_PROVIDER_SITE_OTHER): Payer: 59

## 2014-08-08 ENCOUNTER — Other Ambulatory Visit: Payer: Self-pay

## 2014-08-08 DIAGNOSIS — D519 Vitamin B12 deficiency anemia, unspecified: Secondary | ICD-10-CM

## 2014-08-08 DIAGNOSIS — Z0000 Encounter for general adult medical examination without abnormal findings: Secondary | ICD-10-CM

## 2014-08-08 DIAGNOSIS — D518 Other vitamin B12 deficiency anemias: Secondary | ICD-10-CM

## 2014-08-08 LAB — CBC WITH DIFFERENTIAL/PLATELET
BASOS PCT: 1 % (ref 0.0–3.0)
Basophils Absolute: 0.1 10*3/uL (ref 0.0–0.1)
EOS ABS: 0.1 10*3/uL (ref 0.0–0.7)
Eosinophils Relative: 1.2 % (ref 0.0–5.0)
HEMATOCRIT: 46.4 % — AB (ref 36.0–46.0)
HEMOGLOBIN: 15.9 g/dL — AB (ref 12.0–15.0)
LYMPHS ABS: 2.6 10*3/uL (ref 0.7–4.0)
LYMPHS PCT: 47.8 % — AB (ref 12.0–46.0)
MCHC: 34.2 g/dL (ref 30.0–36.0)
MCV: 93.8 fl (ref 78.0–100.0)
MONO ABS: 0.5 10*3/uL (ref 0.1–1.0)
Monocytes Relative: 8.6 % (ref 3.0–12.0)
NEUTROS ABS: 2.2 10*3/uL (ref 1.4–7.7)
Neutrophils Relative %: 41.4 % — ABNORMAL LOW (ref 43.0–77.0)
Platelets: 307 10*3/uL (ref 150.0–400.0)
RBC: 4.95 Mil/uL (ref 3.87–5.11)
RDW: 15.4 % (ref 11.5–15.5)
WBC: 5.4 10*3/uL (ref 4.0–10.5)

## 2014-08-08 LAB — LIPID PANEL
CHOLESTEROL: 337 mg/dL — AB (ref 0–200)
HDL: 131 mg/dL (ref >39.00)
LDL Cholesterol: 173 mg/dL — ABNORMAL HIGH (ref 0–99)
NONHDL: 206
Total CHOL/HDL Ratio: 3
Triglycerides: 166 mg/dL — ABNORMAL HIGH (ref 0.0–149.0)
VLDL: 33.2 mg/dL (ref 0.0–40.0)

## 2014-08-08 LAB — HEPATIC FUNCTION PANEL
ALBUMIN: 4.4 g/dL (ref 3.5–5.2)
ALT: 49 U/L — ABNORMAL HIGH (ref 0–35)
AST: 90 U/L — ABNORMAL HIGH (ref 0–37)
Alkaline Phosphatase: 75 U/L (ref 39–117)
Bilirubin, Direct: 0.1 mg/dL (ref 0.0–0.3)
TOTAL PROTEIN: 7.8 g/dL (ref 6.0–8.3)
Total Bilirubin: 1 mg/dL (ref 0.2–1.2)

## 2014-08-08 LAB — TSH: TSH: 2.08 u[IU]/mL (ref 0.35–4.50)

## 2014-08-08 LAB — BASIC METABOLIC PANEL
BUN: 5 mg/dL — ABNORMAL LOW (ref 6–23)
CHLORIDE: 94 meq/L — AB (ref 96–112)
CO2: 25 mEq/L (ref 19–32)
Calcium: 8.6 mg/dL (ref 8.4–10.5)
Creatinine, Ser: 0.4 mg/dL (ref 0.4–1.2)
GFR: 154.05 mL/min (ref >60.00)
GLUCOSE: 78 mg/dL (ref 70–99)
POTASSIUM: 3.7 meq/L (ref 3.5–5.1)
SODIUM: 132 meq/L — AB (ref 135–145)

## 2014-08-08 LAB — VITAMIN B12: Vitamin B-12: 1432 pg/mL — ABNORMAL HIGH (ref 211–911)

## 2014-08-09 ENCOUNTER — Other Ambulatory Visit: Payer: Self-pay

## 2014-08-11 ENCOUNTER — Ambulatory Visit (INDEPENDENT_AMBULATORY_CARE_PROVIDER_SITE_OTHER): Payer: 59 | Admitting: Family Medicine

## 2014-08-11 DIAGNOSIS — D518 Other vitamin B12 deficiency anemias: Secondary | ICD-10-CM

## 2014-08-11 DIAGNOSIS — D519 Vitamin B12 deficiency anemia, unspecified: Secondary | ICD-10-CM

## 2014-08-11 LAB — METHYLMALONIC ACID, SERUM: Methylmalonic Acid, Quant: 54 nmol/L — ABNORMAL LOW (ref 87–318)

## 2014-08-11 MED ORDER — CYANOCOBALAMIN 1000 MCG/ML IJ SOLN
1000.0000 ug | Freq: Once | INTRAMUSCULAR | Status: AC
  Administered 2014-08-11: 1000 ug via INTRAMUSCULAR

## 2014-08-15 ENCOUNTER — Ambulatory Visit (INDEPENDENT_AMBULATORY_CARE_PROVIDER_SITE_OTHER): Payer: 59 | Admitting: Internal Medicine

## 2014-08-15 ENCOUNTER — Encounter: Payer: Self-pay | Admitting: Internal Medicine

## 2014-08-15 VITALS — BP 128/70 | Temp 97.7°F | Ht 60.5 in | Wt 111.0 lb

## 2014-08-15 DIAGNOSIS — D518 Other vitamin B12 deficiency anemias: Secondary | ICD-10-CM

## 2014-08-15 DIAGNOSIS — Z0000 Encounter for general adult medical examination without abnormal findings: Secondary | ICD-10-CM

## 2014-08-15 DIAGNOSIS — M810 Age-related osteoporosis without current pathological fracture: Secondary | ICD-10-CM

## 2014-08-15 DIAGNOSIS — G589 Mononeuropathy, unspecified: Secondary | ICD-10-CM

## 2014-08-15 DIAGNOSIS — R945 Abnormal results of liver function studies: Secondary | ICD-10-CM | POA: Insufficient documentation

## 2014-08-15 DIAGNOSIS — G479 Sleep disorder, unspecified: Secondary | ICD-10-CM

## 2014-08-15 DIAGNOSIS — D519 Vitamin B12 deficiency anemia, unspecified: Secondary | ICD-10-CM

## 2014-08-15 DIAGNOSIS — E785 Hyperlipidemia, unspecified: Secondary | ICD-10-CM

## 2014-08-15 DIAGNOSIS — R7989 Other specified abnormal findings of blood chemistry: Secondary | ICD-10-CM | POA: Insufficient documentation

## 2014-08-15 DIAGNOSIS — Z23 Encounter for immunization: Secondary | ICD-10-CM

## 2014-08-15 DIAGNOSIS — G629 Polyneuropathy, unspecified: Secondary | ICD-10-CM

## 2014-08-15 DIAGNOSIS — Z1211 Encounter for screening for malignant neoplasm of colon: Secondary | ICD-10-CM

## 2014-08-15 NOTE — Patient Instructions (Addendum)
Reevaluated diet and avoid processed foods   Fresh fruits and veggies  Are helpful .  Your good cholesterol is  Very high is very good.  But the bad is elevated also.   Taking vit d  Supplement .  And continue the prolia   Get  A mammogram .  Should be covered as routine . [facility] or [facility].   Plan on looking into pap smear for next year. Or when able   Do the stool test for blood  In lieu of the colonoscopy every year.   Consider talking about the   Sleep and mind stress. With counselor   You liver tests are abnormal uncertain cause and could resolve as in past.  NO causing you a symptom at this time.   Avoid  Regular tylenol advil type products and  Reg alcohol use.   Keep appt with dr Keane Scrape send him lab results also.  Can go to every other week of b12 injections   And follow . Your b12 levels are high .   Plan repeat lab tests in  2-3 months   I will put in orders related to b12 and liver panel ROV in  4-6 months or as needed

## 2014-08-15 NOTE — Progress Notes (Signed)
Pre visit review using our clinic review tool, if applicable. No additional management support is needed unless otherwise documented below in the visit note.   Chief Complaint  Patient presents with  . Annual Exam    HPI: Patient comes in today for Preventive Health Care visit  Since last visit no new problems concern that she is Shrinking  Has had  One inj of prolia so far .  Mom had BP  And osteoporosis .    No heart attack or stroke. Neuropathy b 12 :No falling.  Walking with cane .   Supposed to use walker at home  . Gabapentin some help  Happy able to walk.  Diuretic didn't work for prn bloating trazadone not working  For sleep  Mind  Difficult at night and hard to sleep   tv trying to turn off .   On b12 inj q week  Had level last .lab Spiritual  improtant to her  Getting through hard times.   Has ? About risk pancreatic cancer   No fam hx   Health Maintenance  Topic Date Due  . Zostavax  09/15/2012  . Pap Smear  03/27/2013  . Mammogram  07/29/2013  . Colonoscopy  12/15/2013  . Influenza Vaccine  07/16/2015  . Tetanus/tdap  11/20/2019   Health Maintenance Review LIFESTYLE:  Exercise:   Gets around  With cane  Tobacco/ETS:no Alcohol: 2 on weekend  Watching [employer] Sugar beverages:no Sleep: continuing problematic  Drug use: no Bone density: osteoprosis  Colonoscopy:  No will do stool tests  mammo overdue   ROS:  GEN/ HEENT: No fever, significant weight changes sweats headaches vision problems hearing changes, CV/ PULM; No chest pain shortness of breath cough, syncope,edema  change in exercise tolerance. GI /GU: No adominal pain, vomiting, change in bowel habits. No blood in the stool. No significant GU symptoms. SKIN/HEME: ,no acute skin rashes suspicious lesions or bleeding. No lymphadenopathy, nodules, masses.  NEURO/ PSYCH:   As per hpi . IMM/ Allergy: No unusual infections.  Allergy .   REST of 12 system review negative except as per HPI   Past Medical History   Diagnosis Date  . GERD (gastroesophageal reflux disease)     on nexium since 1989 ? had esoph ulceration reported egd not in record  . History of esophageal ulcer   . Hyperlipidemia   . Osteoporosis     by dexa in the past failed orals has GERD last dexa 4/11 -1.4, -1.5 relast Jan 2010  . Fracture     hx in the past  . 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 Primiparous   . DJD (degenerative joint disease)     MRI spine DJD 2011  ack surgery consult.  . Depression     [facility]  . Sleep disorder     decreased  . B12 deficiency     gives her own shots  . Fibromyalgia     ? who made this dx. ? rheum?  Marland Kitchen Arthritis   . Dislocated shoulder     2008, Constant shoulder pain   . Hypothyroidism     Family History  Problem Relation Age of Onset  . Hypertension Mother   . Osteoporosis Mother   . Kidney failure Mother   . Aneurysm Father     Brain    History   Social History  . Marital Status: Divorced    Spouse Name: N/A    Number of  Children: N/A  . Years of Education: BS and MBA   Social History Main Topics  . Smoking status: Never Smoker   . Smokeless tobacco: None  . Alcohol Use: Yes     Comment: OCCASIONAL  . Drug Use: No  . Sexual Activity: None   Other Topics Concern  . 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    Outpatient Encounter Prescriptions as of 08/15/2014  Medication Sig  . Calcium Citrate-Vitamin D (CITRACAL + D PO) Take 1 tablet by mouth daily.  . cyanocobalamin (,VITAMIN B-12,) 1000 MCG/ML injection Inject 1 mL (1,000 mcg total) into the muscle once.  Marland Kitchen FOLIC ACID PO Take 1 tablet by mouth daily.  Marland Kitchen gabapentin (NEURONTIN) 800  MG tablet Take 1 tablet (800 mg total) by mouth 3 (three) times daily.  Marland Kitchen MAGNESIUM PO Take 1 tablet by mouth daily.  . Multiple Vitamin (MULTIVITAMIN WITH MINERALS) TABS tablet Take 1 tablet by mouth daily.  . Omega-3 Fatty Acids (FISH OIL PO) Take 1 capsule by mouth daily.  . pantoprazole (PROTONIX) 40 MG tablet Take 1 tablet (40 mg total) by mouth daily.  Marland Kitchen triamterene-hydrochlorothiazide (MAXZIDE-25) 37.5-25 MG per tablet Take 0.5 tablets by mouth daily. For  5 days  Or as directed  . [DISCONTINUED] celecoxib (CELEBREX) 200 MG capsule Take 200 mg by mouth daily.  . [DISCONTINUED] traZODone (DESYREL) 50 MG tablet Take 25-50 mg by mouth at bedtime as needed for sleep.    EXAM:  BP 128/70  Temp(Src) 97.7 F (36.5 C) (Oral)  Ht 5' 0.5" (1.537 m)  Wt 111 lb (50.349 kg)  BMI 21.31 kg/m2  Body mass index is 21.31 kg/(m^2). bp right 130/80 Physical Exam: Vital signs reviewed did not get up on table but examined in chair as per pat wish etc and standing EAV:WUJW is a well-developed well-nourished alert cooperative    who appearsr stated age in no acute distress.groomed walks with can unsteady on arise    Petite  Well groomed  Walk with cane  Cannot get upon table so exam limited but this  HEENT: normocephalic atraumatic , Eyes: PERRL EOM's full, conjunctiva clear, Nares: paten,t no deformity discharge or tenderness., Ears: no deformity EAC's clear TMs with normal landmarks. Some flakiness in eac canal  Mouth: clear OP, no lesions, edema.  Moist mucous membranes. Dentition in adequate repair. NECK: supple without masses, thyromegaly or bruits. CHEST/PULM:  Clear to auscultation and percussion breath sounds equal no wheeze , rales or rhonchi. No chest wall deformities or tenderness. CV: PMI is nondisplaced, S1 S2 no gallops, murmurs, rubs. Peripheral pulses are full without delay.No JVD .  Declined breast exam ABDOMEN: Bowel sounds normal nontender  No guard or rebound, no hepato splenomegal no  CVA tenderness. Exam sitting  Extremtities:  No clubbing cyanosis or edema, no acute joint swelling or redness no focal atrophy NEURO:  Oriented x3, cranial nerves 3-12 appear to be intact, no obvious focal weakness,gait  Abnormal  Arise from chair independent with holdling on . Walks with cane flat footed  Swings right leg  Fairly steady . Dec sens in feet no ulcers  noted   SKIN: No acute rashes normal turgor, color, no bruising or petechiae. PSYCH: Oriented, good eye contact, , cognition and judgment appear normal.nl speech  Emotional at times disc divorce and new identitiy of single .  Calm .  And not agitated  LN: no cervical axillaryadenopathy  Lab Results  Component Value Date   WBC 5.4 08/08/2014   HGB 15.9* 08/08/2014   HCT 46.4* 08/08/2014   PLT 307.0 08/08/2014   GLUCOSE 78 08/08/2014   CHOL 337* 08/08/2014   TRIG 166.0* 08/08/2014   HDL 131.00 08/08/2014   LDLDIRECT 120.5 07/30/2011   LDLCALC 173* 08/08/2014   ALT 49* 08/08/2014   AST 90* 08/08/2014   NA 132* 08/08/2014   K 3.7 08/08/2014   CL 94* 08/08/2014   CREATININE 0.4 08/08/2014   BUN 5* 08/08/2014   CO2 25 08/08/2014   TSH 2.08 08/08/2014   INR 0.88 09/20/2010   HGBA1C 5.6 04/17/2014    ASSESSMENT AND PLAN:  Discussed the following assessment and plan:  Visit for preventive health examination - ifob limitation with colonsocopy get mammo can wait on pap . flu vaccine  Need for prophylactic vaccination and inoculation against influenza - Plan: Flu Vaccine QUAD 36+ mos PF IM (Fluarix Quad PF)  Screen for colon cancer - ifob delcines colonosc and impracticle at this time for her - Plan: Fecal occult blood, imunochemical  Osteoporosis, unspecified - on prolia  vit d   Anemia, B12 deficiency - can dec to q o week high level  SLEEP DISORDER/DISTURBANCE - habit and mind related  coping  sleep hygiene consdier counseling strategies disc   Neuropathy - able to walk at this time but still problematic  - Plan: Hepatitis C antibody,  Hepatitis B surface antigen, Hepatitis B surface antibody, Hepatitis B core antibody, total, Hepatic function panel, Ferritin, IBC panel, Ceruloplasmin, ANA  Abnormal liver function test - hx of same review record and fu labs  minimal etoh or meds has high lipids fu labs serologies and consider [place] as apparently not done  in past - Plan: Hepatitis C antibody, Hepatitis B surface antigen, Hepatitis B surface antibody, Hepatitis B core antibody, total, Hepatic function panel, Ferritin, IBC panel, Ceruloplasmin, ANA  Other and unspecified hyperlipidemia  Patient Care Team: Madelin Headings, MD as PCP - General Delia Heady, MD as Consulting Physician (Neurology) Patient Instructions  Reevaluated diet and avoid processed foods   Fresh fruits and veggies  Are helpful .  Your good cholesterol is  Very high is very good.  But the bad is elevated also.   Taking vit d  Supplement .  And continue the prolia   Get  A mammogram .  Should be covered as routine . [facility] or [facility].   Plan on looking into pap smear for next year. Or when able   Do the stool test for blood  In lieu of the colonoscopy every year.   Consider talking about the   Sleep and mind stress. With counselor   You liver tests are abnormal uncertain cause and could resolve as in past.  NO causing you a symptom at this time.   Avoid  Regular tylenol advil type products and  Reg alcohol use.   Keep appt with dr Keane Scrape send him lab results also.  Can go to every other week of b12 injections   And follow . Your b12 levels are high .   Plan repeat lab tests in  2-3 months  I will put in orders related to b12 and liver panel ROV in  4-6 months or as needed       Burna Mortimer K. Panosh M.D.

## 2014-08-15 NOTE — Assessment & Plan Note (Signed)
Very high hdl 130 and highldl 170 range ratio is 3  LSI indicated and follow

## 2014-08-16 ENCOUNTER — Telehealth: Payer: Self-pay | Admitting: Internal Medicine

## 2014-08-16 NOTE — Telephone Encounter (Signed)
Caller: Adriana Figueroa/Patient; Phone: [phone]; Reason for Call: Pt called regarding left facial numbness 3 hours after local anesthesia for dental fillings.  Reported numbness should have worn off in 1 hour.  She said she tried to call her dentist office but they would not talk to her.  She reported difficulty talking and numbesss of left side of face, not her body.  Became upset with requested demographic information which RN explained was necessary to open her chart.  RN advised, if having an emergency, she should call 911 or go to ED.  Pt shouted multiple profanities and hung up. CAN supervisor notified.  Office:  Please review with MD.

## 2014-08-16 NOTE — Telephone Encounter (Signed)
Spoke with patient at approximately 1:30pm who inquired about a dentist she could see in the [place] area as she was not please with the dentist she currently sees.  Pt reports she had a 10am appt today with her current dentist to have fillings replaced.  Pt states she is still numb from the anestesia she was given and felt she should not be number 3 hours later.  Pt was instructed to contact dentist with concerns.  Pt was given Dr. Brian Coates as a dentist she could choose to seLenn Sinkquired if Dr. Alvina Filbert accepted her insurance, informed pt I wasn't sure but I would find out and call her back.  Returned called to pt and left message advising Dr. Alvina Filbert does accept [facility] insurance and he was accepting new patients, pt instructed to contact Dr. Alvina Filbert office to schedule appt, office number [phone].

## 2014-08-18 ENCOUNTER — Encounter: Payer: Self-pay | Admitting: Neurology

## 2014-08-18 ENCOUNTER — Ambulatory Visit (INDEPENDENT_AMBULATORY_CARE_PROVIDER_SITE_OTHER): Payer: 59 | Admitting: Neurology

## 2014-08-18 VITALS — BP 122/70 | HR 70 | Ht 65.0 in | Wt 110.6 lb

## 2014-08-18 DIAGNOSIS — G609 Hereditary and idiopathic neuropathy, unspecified: Secondary | ICD-10-CM

## 2014-08-18 DIAGNOSIS — R269 Unspecified abnormalities of gait and mobility: Secondary | ICD-10-CM

## 2014-08-18 DIAGNOSIS — E538 Deficiency of other specified B group vitamins: Secondary | ICD-10-CM

## 2014-08-18 MED ORDER — GABAPENTIN 800 MG PO TABS
800.0000 mg | ORAL_TABLET | Freq: Four times a day (QID) | ORAL | Status: DC
Start: 2014-08-18 — End: 2015-02-26

## 2014-08-18 NOTE — Progress Notes (Signed)
[facility] [address] [place]. [place] [address] [phone]       OFFICE CONSULT NOTE  Adriana Figueroa Date of Birth:  [birth year] Medical Record Number:  [id number]   Referring MD:  Berniece Andreas  Reason for Referral:  Gait difficulty  HPI: 32 year Asian [place] origin lady is a poor historian. She is provided slightly different history in the past when admitted to the hospital in November 2014 seen by Dr Roseanne Reno as well as when seen by neurologist Dr. Tobie Poet in February  2015  chiefly with difficulty walking since last fall and was hospitalized to [facility] and found to have low potassium of 2.8, UTI . She was recently found to have   a low vitamin B12 level of 169 on 12/19/2013.Marland Kitchen She was not compliant with the therapy for B 12   supplementation with Dr. Fabian Sharp.. She did not want injections and has been taking B12 tablets. She was referred to see Dr. Allena Katz neurologist at [facility] neurology on 01/24/14 but did not have a good visit. The patient was described as being quite agitated, uncooperative and refused to complete exam and evaluation and treatment recommendations hence were incomplete. She described to Dr. Allena Katz that she cannot feel anything anywhere but today she tells me that she has increased sensitivity in both hands as well as in feet from ankle down. She was wheelchair-bound last fall but now states she is able to walk with a wheeled walker since December. She can still fall easily and has to walk slowly. She is a vegetarian in her diet and does not eat any good sources of vitamin B12. She has had underlying psychiatric problems and apparently has not been compliant with her medications or followup with the psychiatrist either. She is still quite tearful about her divorce after 35 years of marriage and not having any social support. She had MRI scan of the lumbar spine showing broad-based foraminal and extraforaminal disc protrusions on the left at L3-4  and L4-5 with only mild foraminal narrowing which were actually unchanged compared to prior study in 2011. The patient's symptoms apparently had started in 2011 without warning and her husband suddenly left the 34 years of marriage hence conversion disorder and depressed mood were felt to be responsible for her symptoms. She denies any memory loss, cognitive difficulties, but bladder bowel incontinence Update 08/18/2014 : She returns for followup of her last visit 4 months ago. She has noticed improvement in her gait and balance after taking the B12 injections regularly. Her last follow vitamins B12 level was on 04/17/14 and was normal at 719. She however continues to have tingling and numbness in her hands which is bothersome. She is taking gabapentin 800 mg three times daily and tolerating it quite well. She is now using a cane to walk most of the time but uses a walker only for long distances. She has not had any falls. She did not undergo MRI of the neck which I had ordered but did undergo lab work. Nerve conduction study done on 05/09/14 by Dr. Anne Hahn is suggestive of a sensory neuropathy. Bloodwork on 04/17/14 vitamin B12 of 719, methylmalonic acid is normal at 120 TSH is normal at 0.76 and hemoglobin A1c at 5.6. ROS:   14 system review of systems is positive for fatigue,  numbness, weakness,   dizziness, depression, and gait ataxia and restless legs.  PMH:  Past Medical History  Diagnosis Date  . GERD (gastroesophageal reflux disease)  on nexium since 1989 ? had esoph ulceration reported egd not in record  . History of esophageal ulcer   . Hyperlipidemia   . Osteoporosis     by dexa in the past failed orals has GERD last dexa 4/11 -1.4, -1.5 relast Jan 2010  . Fracture     hx in the past  . 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 Primiparous   . DJD (degenerative joint disease)     MRI spine DJD 2011  ack surgery consult.  . Depression     [facility]  . Sleep disorder      decreased  . B12 deficiency     gives her own shots  . Fibromyalgia     ? who made this dx. ? rheum?  Marland Kitchen Arthritis   . Dislocated shoulder     2008, Constant shoulder pain   . Hypothyroidism     Social History:  History   Social History  . Marital Status: Divorced    Spouse Name: N/A    Number of Children: 0  . Years of Education: BS and MBA   Occupational History  . Not on file.   Social History Main Topics  . Smoking status: Never Smoker   . Smokeless tobacco: Not on file  . Alcohol Use: Yes     Comment: OCCASIONAL  . Drug Use: No  . Sexual Activity: Not on file   Other Topics Concern  . 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    Medications:   Current Outpatient Prescriptions on File Prior to Visit  Medication Sig Dispense Refill  . Calcium Citrate-Vitamin D (CITRACAL + D PO) Take 1 tablet by mouth daily.      . cyanocobalamin (,VITAMIN B-12,) 1000 MCG/ML injection Inject 1 mL (1,000 mcg total) into the muscle once.  1 mL  0  . FOLIC ACID PO Take 1 tablet by mouth daily.      Marland Kitchen MAGNESIUM PO Take 1 tablet by mouth daily.      . Multiple Vitamin (MULTIVITAMIN WITH MINERALS) TABS tablet Take 1 tablet by mouth daily.      . Omega-3 Fatty Acids (FISH OIL PO) Take 1 capsule by mouth daily.      . pantoprazole (PROTONIX) 40 MG tablet Take 1 tablet (40 mg total) by mouth daily.  30 tablet  3  . triamterene-hydrochlorothiazide (MAXZIDE-25) 37.5-25 MG per tablet Take 0.5 tablets by mouth daily. For  5 days  Or as directed  20 tablet  1   No current facility-administered medications on file prior to visit.    Allergies:   Allergies  Allergen Reactions  . Codeine  Nausea And Vomiting  . Penicillins Other (See Comments)    unknown  . Vicodin [Hydrocodone-Acetaminophen] Nausea And Vomiting    GI upset    Physical Exam General: frail middle aged Asian [place] lady, seated, in no evident distress Head: head normocephalic and atraumatic. Oroparynx benign Neck: supple with no carotid or supraclavicular bruits Cardiovascular: regular rate and rhythm, no murmurs Musculoskeletal: no deformity Skin:  no  rash/petichiae Vascular:  Normal pulses all extremities Filed Vitals:   08/18/14 1017  BP: 122/70  Pulse: 70    Neurologic Exam Mental Status: Awake and fully alert. Oriented to place and time. Recent and remote memory intact. Attention span, concentration and fund of knowledge appropriate. Mood and affect labile.  Cranial Nerves: Fundoscopic exam not done .  Pupils equal, briskly reactive to light. Extraocular movements full without nystagmus. Visual fields full to confrontation. Hearing intact. Facial sensation intact. Face, tongue, palate moves normally and symmetrically.  Motor: mild decreased bulk and tone throughout. Mild generalized decrease strength in all tested extremity muscles   Sensory.:hyperesthesia to touch and pinprick in both hands and legs from knee down symmetrically and diminished vibratory sensation over ankles and toes bilaterally. Position sense impaired over toes bilaterally . Rhomberg`s sign negative.  Coordination: Rapid alternating movements normal in all extremities. Finger-to-nose and heel-to-shin performed accurately bilaterally. Gait and Station: Arises from chair with  difficulty. Stance is broad based. Gait demonstrates wide base, ataxia and imbalance . Uses a wheeled walker Reflexes: 2+ and symmetric in upper extremities and depressed at knees and ankles. Toes downgoing.       ASSESSMENT: 57 year Asian [place] lady with a history of progressive paresthesias, gait ataxia likely related to B12 deficiency and underlying  peripheral neuropathy who has shown improvement on B12 replacement therapy.     PLAN: I had a long discussion with the patient with regards to her  sensory neuropathy related to vitamin B12 deficiency. I discussed the EMG nerve conduction findings with her and advised her to continue with vitamin B12 supplementation for life. Increased gabapentin to 800 mg 4 times daily to help with her paresthesias. She was also advised fall and  safety precautions. Return for followup in 6 months with Heide Guile, NP. call earlier if necessary   Note: This document was prepared with digital dictation and possible smart phrase technology. Any transcriptional errors that result from this process are unintentional.

## 2014-08-18 NOTE — Patient Instructions (Addendum)
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 I discussed the EMG nerve conduction findings with her and advised her to continue with vitamin B12 supplementation for life. Increased gabapentin to 800 mg 4 times daily to help with her paresthesias. She was also advised fall and  safety precautions. Return for followup in 6 months with Heide Guile, NP. call earlier if necessary  Fall Prevention and Home Safety Falls cause injuries and can affect all age groups. It is possible to use preventive measures to significantly decrease the likelihood of falls. There are many simple measures which can make your home safer and prevent falls. OUTDOORS  Repair cracks and edges of walkways and driveways.  Remove high doorway thresholds.  Trim shrubbery on the main path into your home.  Have good outside lighting.  Clear walkways of tools, rocks, debris, and clutter.  Check that handrails are not broken and are securely fastened. Both sides of steps should have handrails.  Have leaves, snow, and ice cleared regularly.  Use sand or salt on walkways during winter months.  In the garage, clean up grease or oil spills. BATHROOM  Install night lights.  Install grab bars by the toilet and in the tub and shower.  Use non-skid mats or decals in the tub or shower.  Place a plastic non-slip stool in the shower to sit on, if needed.  Keep floors dry and clean up all water on the floor immediately.  Remove soap buildup in the tub or shower on a regular basis.  Secure bath mats with non-slip, double-sided rug tape.  Remove throw rugs and tripping hazards from the floors. BEDROOMS  Install night lights.  Make sure a bedside light is easy to reach.  Do not use oversized bedding.  Keep a telephone by your bedside.  Have a firm chair with side arms to use for getting dressed.  Remove throw rugs and tripping hazards from the  floor. KITCHEN  Keep handles on pots and pans turned toward the center of the stove. Use back burners when possible.  Clean up spills quickly and allow time for drying.  Avoid walking on wet floors.  Avoid hot utensils and knives.  Position shelves so they are not too high or low.  Place commonly used objects within easy reach.  If necessary, use a sturdy step stool with a grab bar when reaching.  Keep electrical cables out of the way.  Do not use floor polish or wax that makes floors slippery. If you must use wax, use non-skid floor wax.  Remove throw rugs and tripping hazards from the floor. STAIRWAYS  Never leave objects on stairs.  Place handrails on both sides of stairways and use them. Fix any loose handrails. Make sure handrails on both sides of the stairways are as long as the stairs.  Check carpeting to make sure it is firmly attached along stairs. Make repairs to worn or loose carpet promptly.  Avoid placing throw rugs at the top or bottom of stairways, or properly secure the rug with carpet tape to prevent slippage. Get rid of throw rugs, if possible.  Have an electrician put in a light switch at the top and bottom of the stairs. OTHER FALL PREVENTION TIPS  Wear low-heel or rubber-soled shoes that are supportive and fit well. Wear closed toe shoes.  When using a stepladder, make sure it is fully opened and both spreaders are firmly locked. Do  not climb a closed stepladder.  Add color or contrast paint or tape to grab bars and handrails in your home. Place contrasting color strips on first and last steps.  Learn and use mobility aids as needed. Install an electrical emergency response system.  Turn on lights to avoid dark areas. Replace light bulbs that burn out immediately. Get light switches that glow.  Arrange furniture to create clear pathways. Keep furniture in the same place.  Firmly attach carpet with non-skid or double-sided tape.  Eliminate uneven  floor surfaces.  Select a carpet pattern that does not visually hide the edge of steps.  Be aware of all pets. OTHER HOME SAFETY TIPS  Set the water temperature for 120 F (48.8 C).  Keep emergency numbers on or near the telephone.  Keep smoke detectors on every level of the home and near sleeping areas. Document Released: 11/21/2002 Document Revised: 06/01/2012 Document Reviewed: 02/20/2012 [facility] Patient Information 2015 [place], [place]. This information is not intended to replace advice given to you by your health care provider. Make sure you discuss any questions you have with your health care provider.

## 2014-08-22 ENCOUNTER — Encounter: Payer: Self-pay | Admitting: Internal Medicine

## 2014-08-22 ENCOUNTER — Ambulatory Visit (INDEPENDENT_AMBULATORY_CARE_PROVIDER_SITE_OTHER): Payer: 59 | Admitting: Internal Medicine

## 2014-08-22 VITALS — BP 136/84 | Temp 97.9°F | Ht 65.0 in | Wt 109.8 lb

## 2014-08-22 DIAGNOSIS — L84 Corns and callosities: Secondary | ICD-10-CM

## 2014-08-22 DIAGNOSIS — E538 Deficiency of other specified B group vitamins: Secondary | ICD-10-CM

## 2014-08-22 DIAGNOSIS — M21619 Bunion of unspecified foot: Secondary | ICD-10-CM

## 2014-08-22 DIAGNOSIS — G589 Mononeuropathy, unspecified: Secondary | ICD-10-CM

## 2014-08-22 DIAGNOSIS — M21612 Bunion of left foot: Secondary | ICD-10-CM | POA: Insufficient documentation

## 2014-08-22 DIAGNOSIS — M79672 Pain in left foot: Secondary | ICD-10-CM | POA: Insufficient documentation

## 2014-08-22 DIAGNOSIS — M79609 Pain in unspecified limb: Secondary | ICD-10-CM

## 2014-08-22 DIAGNOSIS — G629 Polyneuropathy, unspecified: Secondary | ICD-10-CM

## 2014-08-22 NOTE — Patient Instructions (Signed)
This is a painful callus .   And has risk to develop into an ulcer or infection. Please soak the foot in water or epsoms ok ( not too hot because you have neuropathy )  Good moisturizing .  Will arrange a podiatry appt  .  For you. ( you can change the time if needed)   Because of the neuropathy need special monitoring of feet.   i think you could try every  2 week or 14 days b 12 injection and check level in 3 months to make sure level is still ok.

## 2014-08-22 NOTE — Progress Notes (Signed)
Pre visit review using our clinic review tool, if applicable. No additional management support is needed unless otherwise documented below in the visit note.  Chief Complaint  Patient presents with  . Toe Injury    Left great toe.  Ongoing for 1 month.    HPI: Patient Adriana Figueroa  comes in today for SDA for  new problem evaluation. Has neuropathy  As noted  And has insidious increase in pain left great toe around callus area  . Wears sandals  And unable to pout on shoes   Cause of pain.  No pain or traum noted per se. And no other rx . Saw dr Pearlean Brownie last week and  Inc the neurontin  for her.  Now on q 2 weeks b12 injections.  As last level was 1400 range .   ROS: See pertinent positives and negatives per HPI. Had some dental work  Recently  And had issues with this also.  Past Medical History  Diagnosis Date  . GERD (gastroesophageal reflux disease)     on nexium since 1989 ? had esoph ulceration reported egd not in record  . History of esophageal ulcer   . Hyperlipidemia   . Osteoporosis     by dexa in the past failed orals has GERD last dexa 4/11 -1.4, -1.5 relast Jan 2010  . Fracture     hx in the past  . 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 Primiparous   . DJD (degenerative joint disease)     MRI spine DJD 2011  ack surgery consult.  . Depression     [facility]  . Sleep disorder     decreased  . B12 deficiency     gives her own shots  . Fibromyalgia     ? who made this dx. ? rheum?  Marland Kitchen Arthritis   . Dislocated shoulder     2008, Constant shoulder pain   . Hypothyroidism     Family History  Problem Relation Age of Onset  . Hypertension Mother   . Osteoporosis Mother   . Kidney failure Mother   . Aneurysm Father     Brain    History   Social History  . Marital Status: Divorced    Spouse Name: N/A    Number of Children: 0  . Years of Education: BS and MBA   Social History Main Topics  . Smoking status: Never Smoker   .  Smokeless tobacco: None  . Alcohol Use: Yes     Comment: OCCASIONAL  . Drug Use: No  . Sexual Activity: None   Other Topics Concern  . 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    Outpatient Encounter Prescriptions as of 08/22/2014  Medication Sig  . Calcium Citrate-Vitamin D (CITRACAL + D PO) Take 1 tablet by mouth daily.  . cyanocobalamin (,VITAMIN B-12,) 1000 MCG/ML injection Inject 1 mL (1,000 mcg total) into the muscle once.  Marland Kitchen FOLIC ACID PO Take 1 tablet by mouth daily.  Marland Kitchen gabapentin (NEURONTIN) 800 MG tablet Take 1 tablet (800 mg total) by mouth  4 (four) times daily.  Marland Kitchen MAGNESIUM PO Take 1 tablet by mouth daily.  . Multiple Vitamin (MULTIVITAMIN WITH MINERALS) TABS tablet Take 1 tablet by mouth daily.  . Omega-3 Fatty Acids (FISH OIL PO) Take 1 capsule by mouth daily.  . pantoprazole (PROTONIX) 40 MG tablet Take 1 tablet (40 mg total) by mouth daily.  Marland Kitchen triamterene-hydrochlorothiazide (MAXZIDE-25) 37.5-25 MG per tablet Take 0.5 tablets by mouth daily. For  5 days  Or as directed    EXAM:  BP 136/84  Temp(Src) 97.9 F (36.6 C) (Oral)  Ht  (1.651 m)  Wt 109 lb 12.8 oz (49.805 kg)  BMI 18.27 kg/m2  Body mass index is 18.27 kg/(m^2).  GENERAL: vitals reviewed and listed above, alert, oriented, appears well hydrated and in no acute distress slender  Walking flat foot with cane  HEENT: atraumatic, conjunctiva  clear, no obvious abnormalities on inspection of external nose and earscap refill  MS: moves all extremities  Left foot pulse intact  Symmetrical color .   Has bunion left more than right neg tender at that area  1.5 cm callus at great mtp area   That is quite tender but  minimal redness or warmth and no ulcers or infection noted  No wound noted . Arch has 2 mm corn like area that "has been there her whole life"   PSYCH: pleasant and cooperative, and polite  ASSESSMENT AND PLAN:  Discussed the following assessment and plan:  Foot callus - painful and porminent with bunion . soaking and podiatry to assess   - Plan: Ambulatory referral to Podiatry  Neuropathy - Plan: Ambulatory referral to Podiatry  Foot pain, left - Plan: Ambulatory referral to Podiatry  B12 deficiency - due for b12 injec in 3 days   Bunion of left foot  -Patient advised to return or notify health care team  if symptoms worsen ,persist or new concerns arise.  Patient Instructions  This is a painful callus .   And has risk to develop into an ulcer or infection. Please soak the foot in water or epsoms ok ( not too hot because you have neuropathy )  Good moisturizing .  Will arrange a podiatry appt  .  For you. ( you can change the time if needed)   Because of the neuropathy need special monitoring of feet.   i think you could try every  2 week or 14 days b 12 injection and check level in 3 months to make sure level is still ok.     Neta Mends. Omare Bilotta M.D.

## 2014-08-23 ENCOUNTER — Telehealth: Payer: Self-pay | Admitting: Internal Medicine

## 2014-08-23 ENCOUNTER — Encounter: Payer: Self-pay | Admitting: Physician Assistant

## 2014-08-23 ENCOUNTER — Ambulatory Visit (INDEPENDENT_AMBULATORY_CARE_PROVIDER_SITE_OTHER): Payer: 59 | Admitting: Physician Assistant

## 2014-08-23 ENCOUNTER — Ambulatory Visit: Payer: Self-pay | Admitting: Physician Assistant

## 2014-08-23 VITALS — BP 124/80 | HR 72 | Temp 98.4°F | Resp 18 | Wt 109.0 lb

## 2014-08-23 DIAGNOSIS — M79609 Pain in unspecified limb: Secondary | ICD-10-CM

## 2014-08-23 DIAGNOSIS — M7989 Other specified soft tissue disorders: Secondary | ICD-10-CM

## 2014-08-23 DIAGNOSIS — M79675 Pain in left toe(s): Secondary | ICD-10-CM

## 2014-08-23 NOTE — Progress Notes (Signed)
Subjective:    Patient ID: Adriana Figueroa, female    DOB: 1951/06/27, 63 y.o.   MRN: [id number]  Toe Pain  The incident occurred 12 to 24 hours ago. The incident occurred at home. There was no injury mechanism. The pain is present in the left toes (left 2nd toe). Quality: throbbing. The pain is at a severity of 10/10. The pain is severe. The pain has been constant since onset. Associated symptoms include numbness (has hx of neuropathy) and tingling (has hx of neuropathy). Pertinent negatives include no inability to bear weight, loss of motion, loss of sensation or muscle weakness. She reports no foreign bodies present. The symptoms are aggravated by palpation and movement. Treatments tried: epsom salt soaks, icyhot. The treatment provided no relief.      Review of Systems  Constitutional: Negative for fever and chills.  Respiratory: Negative for shortness of breath.   Cardiovascular: Negative for chest pain.  Gastrointestinal: Negative for nausea, vomiting and diarrhea.  Neurological: Positive for tingling (has hx of neuropathy) and numbness (has hx of neuropathy). Negative for syncope and headaches.  All other systems reviewed and are negative.    Past Medical History  Diagnosis Date  . GERD (gastroesophageal reflux disease)     on nexium since 1989 ? had esoph ulceration reported egd not in record  . History of esophageal ulcer   . Hyperlipidemia   . Osteoporosis     by dexa in the past failed orals has GERD last dexa 4/11 -1.4, -1.5 relast Jan 2010  . Fracture     hx in the past  . 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 Primiparous   . DJD (degenerative joint disease)     MRI spine DJD 2011  ack surgery consult.  . Depression     [facility]  . Sleep disorder     decreased  . B12 deficiency     gives her own shots  . Fibromyalgia     ? who made this dx. ? rheum?  Marland Kitchen Arthritis   . Dislocated shoulder     2008, Constant shoulder pain   .  Hypothyroidism     History   Social History  . Marital Status: Divorced    Spouse Name: N/A    Number of Children: 0  . Years of Education: BS and MBA   Occupational History  . Not on file.   Social History Main Topics  . Smoking status: Never Smoker   . Smokeless tobacco: Not on file  . Alcohol Use: Yes     Comment: OCCASIONAL  . Drug Use: No  . Sexual Activity: Not on file   Other Topics Concern  . 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    Past Surgical History  Procedure Laterality Date  . Dislocated shoulder surgery 2008      Family History  Problem Relation Age of Onset  . Hypertension Mother   . Osteoporosis Mother   . Kidney failure Mother   . Aneurysm Father     Brain  Allergies  Allergen Reactions  . Codeine Nausea And Vomiting  . Penicillins Other (See Comments)    unknown  . Vicodin [Hydrocodone-Acetaminophen] Nausea And Vomiting    GI upset    Current Outpatient Prescriptions on File Prior to Visit  Medication Sig Dispense Refill  . Calcium Citrate-Vitamin D (CITRACAL + D PO) Take 1 tablet by mouth daily.      . cyanocobalamin (,VITAMIN B-12,) 1000 MCG/ML injection Inject 1 mL (1,000 mcg total) into the muscle once.  1 mL  0  . FOLIC ACID PO Take 1 tablet by mouth daily.      Marland Kitchen gabapentin (NEURONTIN) 800 MG tablet Take 1 tablet (800 mg total) by mouth 4 (four) times daily.  90 tablet  3  . MAGNESIUM PO Take 1 tablet by mouth daily.      . Multiple Vitamin (MULTIVITAMIN WITH MINERALS) TABS tablet Take 1 tablet by mouth daily.      . Omega-3 Fatty Acids (FISH OIL PO) Take 1 capsule by mouth daily.      . pantoprazole (PROTONIX) 40 MG tablet  Take 1 tablet (40 mg total) by mouth daily.  30 tablet  3  . triamterene-hydrochlorothiazide (MAXZIDE-25) 37.5-25 MG per tablet Take 0.5 tablets by mouth daily. For  5 days  Or as directed  20 tablet  1   No current facility-administered medications on file prior to visit.    EXAM: BP 124/80  Pulse 72  Temp(Src) 98.4 F (36.9 C) (Oral)  Resp 18  Wt 109 lb (49.442 kg)     Objective:   Physical Exam  Nursing note and vitals reviewed. Constitutional: She is oriented to person, place, and time. She appears well-developed and well-nourished. No distress.  HENT:  Head: Normocephalic and atraumatic.  Eyes: Conjunctivae and EOM are normal.  Cardiovascular: Normal rate, regular rhythm and intact distal pulses.   Pulmonary/Chest: Effort normal and breath sounds normal. No respiratory distress. She exhibits no tenderness.  Musculoskeletal: Normal range of motion. She exhibits tenderness.  The left 2nd toe appear to be swollen as a result of a tight ring on that toe. There is mild ttp. No surrounding erythema, no streaking, or fluctuance.  Neurological: She is alert and oriented to person, place, and time.  Skin: Skin is warm and dry. No rash noted. She is not diaphoretic. No erythema. No pallor.  Psychiatric: She has a normal mood and affect. Her behavior is normal. Judgment and thought content normal.     Lab Results  Component Value Date   WBC 5.4 08/08/2014   HGB 15.9* 08/08/2014   HCT 46.4* 08/08/2014   PLT 307.0 08/08/2014   GLUCOSE 78 08/08/2014   CHOL 337* 08/08/2014   TRIG 166.0* 08/08/2014   HDL 131.00 08/08/2014   LDLDIRECT 120.5 07/30/2011   LDLCALC 173* 08/08/2014   ALT 49* 08/08/2014   AST 90* 08/08/2014   NA 132* 08/08/2014   K 3.7 08/08/2014   CL 94* 08/08/2014   CREATININE 0.4 08/08/2014   BUN 5* 08/08/2014   CO2 25 08/08/2014   TSH 2.08 08/08/2014   INR 0.88 09/20/2010   HGBA1C 5.6 04/17/2014        Assessment & Plan:  Makeila was seen today for swollen toes on left  foot.  Diagnoses and associated orders for this visit:  Pain and swelling of toe of left foot Comments: From ring. Send to jeweler or ED to have ring removed.    Attempts were made to lubricate the ring and  toe, however the ring was not removable with that method. The pt called her jeweler in office, and he has a ring cutting device to remove rings. The pt will go there now to have the ring removed. The pt was instructed to go to the ED if the jeweler is unable to remove the ring. The pt is amenable to this plan.  Return precautions provided.  Plan to follow up as needed, or for worsening or persistent symptoms despite treatment.  Patient Instructions  Please proceed to the jeweler to have the ring removed.  If they are unable to do this, pleas proceed to the Emergency department in order to have your ring cut off. Unfortunately we do not have the proper equipment to do so, but those facilities should.  Please also remove your other rings from your toes in order to prevent this from happening in those toes.  Please keep you appointment with podiatry.  If emergency symptoms discussed during visit developed, seek medical attention immediately.  Followup as needed, or for worsening or persistent symptoms despite treatment.

## 2014-08-23 NOTE — Patient Instructions (Addendum)
Please proceed to the jeweler to have the ring removed.  If they are unable to do this, pleas proceed to the Emergency department in order to have your ring cut off. Unfortunately we do not have the proper equipment to do so, but those facilities should.  Please also remove your other rings from your toes in order to prevent this from happening in those toes.  Please keep you appointment with podiatry.  If emergency symptoms discussed during visit developed, seek medical attention immediately.  Followup as needed, or for worsening or persistent symptoms despite treatment.

## 2014-08-23 NOTE — Telephone Encounter (Signed)
Pt stated the toe next to big toe is swollen. Please advise

## 2014-08-23 NOTE — Telephone Encounter (Signed)
Noted  

## 2014-08-23 NOTE — Telephone Encounter (Signed)
Spoke to the pt.  She complains of a throbbing pain in her toe and swelling.  Denied any other sx.  Tried to send her for x-ray but she declined, stating "I did not break it."  Scheduled with Donell Beers, PAC.  WP informed.

## 2014-08-23 NOTE — Progress Notes (Signed)
Pre visit review using our clinic review tool, if applicable. No additional management support is needed unless otherwise documented below in the visit note. 

## 2014-08-25 ENCOUNTER — Ambulatory Visit (INDEPENDENT_AMBULATORY_CARE_PROVIDER_SITE_OTHER): Payer: 59 | Admitting: Family Medicine

## 2014-08-25 DIAGNOSIS — D519 Vitamin B12 deficiency anemia, unspecified: Secondary | ICD-10-CM

## 2014-08-25 DIAGNOSIS — D518 Other vitamin B12 deficiency anemias: Secondary | ICD-10-CM

## 2014-08-25 MED ORDER — CYANOCOBALAMIN 1000 MCG/ML IJ SOLN
1000.0000 ug | Freq: Once | INTRAMUSCULAR | Status: AC
  Administered 2014-08-25: 1000 ug via INTRAMUSCULAR

## 2014-08-29 ENCOUNTER — Telehealth: Payer: Self-pay | Admitting: Internal Medicine

## 2014-08-29 NOTE — Telephone Encounter (Signed)
Pt came in and said she rc'd a check from [facility] for $35.00 and is not going to deposit until you let her know what it is for. Please call.

## 2014-09-08 ENCOUNTER — Ambulatory Visit (INDEPENDENT_AMBULATORY_CARE_PROVIDER_SITE_OTHER): Payer: 59 | Admitting: Family Medicine

## 2014-09-08 ENCOUNTER — Ambulatory Visit: Payer: 59 | Admitting: Family Medicine

## 2014-09-08 DIAGNOSIS — D519 Vitamin B12 deficiency anemia, unspecified: Secondary | ICD-10-CM

## 2014-09-08 DIAGNOSIS — D518 Other vitamin B12 deficiency anemias: Secondary | ICD-10-CM

## 2014-09-08 MED ORDER — CYANOCOBALAMIN 1000 MCG/ML IJ SOLN
1000.0000 ug | Freq: Once | INTRAMUSCULAR | Status: AC
  Administered 2014-09-08: 1000 ug via INTRAMUSCULAR

## 2014-09-22 ENCOUNTER — Telehealth: Payer: Self-pay | Admitting: Family Medicine

## 2014-09-22 ENCOUNTER — Ambulatory Visit (INDEPENDENT_AMBULATORY_CARE_PROVIDER_SITE_OTHER): Payer: 59 | Admitting: Family Medicine

## 2014-09-22 DIAGNOSIS — D519 Vitamin B12 deficiency anemia, unspecified: Secondary | ICD-10-CM

## 2014-09-22 MED ORDER — CYANOCOBALAMIN 1000 MCG/ML IJ SOLN
1000.0000 ug | Freq: Once | INTRAMUSCULAR | Status: AC
  Administered 2014-09-22: 1000 ug via INTRAMUSCULAR

## 2014-09-22 NOTE — Telephone Encounter (Signed)
don't know the product  Wouldn't take this

## 2014-09-22 NOTE — Telephone Encounter (Signed)
Pt ordered Actalin- Advanced Thyroid Support Formula with Iodine.  Would like to know what are your thoughts on this medication.  Should she take it?

## 2014-09-25 NOTE — Telephone Encounter (Signed)
LMOM for the pt to return my call.  No voicemail on cell.

## 2014-09-26 NOTE — Telephone Encounter (Signed)
Patient informed of pcp's recommendation to not take this product.  Pt voiced understanding and states she will throw medication away.

## 2014-09-26 NOTE — Telephone Encounter (Signed)
Please document what you discussed with the pt.  Thanks!

## 2014-10-06 ENCOUNTER — Ambulatory Visit (INDEPENDENT_AMBULATORY_CARE_PROVIDER_SITE_OTHER): Payer: 59

## 2014-10-06 DIAGNOSIS — E538 Deficiency of other specified B group vitamins: Secondary | ICD-10-CM

## 2014-10-06 MED ORDER — CYANOCOBALAMIN 1000 MCG/ML IJ SOLN
1000.0000 ug | Freq: Once | INTRAMUSCULAR | Status: AC
  Administered 2014-10-06: 1000 ug via INTRAMUSCULAR

## 2014-10-20 ENCOUNTER — Ambulatory Visit (INDEPENDENT_AMBULATORY_CARE_PROVIDER_SITE_OTHER): Payer: 59 | Admitting: Family Medicine

## 2014-10-20 DIAGNOSIS — E538 Deficiency of other specified B group vitamins: Secondary | ICD-10-CM

## 2014-10-20 MED ORDER — CYANOCOBALAMIN 1000 MCG/ML IJ SOLN
1000.0000 ug | Freq: Once | INTRAMUSCULAR | Status: AC
  Administered 2014-10-20: 1000 ug via INTRAMUSCULAR

## 2014-10-27 ENCOUNTER — Encounter: Payer: Self-pay | Admitting: Internal Medicine

## 2014-10-27 ENCOUNTER — Ambulatory Visit (INDEPENDENT_AMBULATORY_CARE_PROVIDER_SITE_OTHER): Payer: 59 | Admitting: Internal Medicine

## 2014-10-27 ENCOUNTER — Telehealth: Payer: Self-pay | Admitting: Internal Medicine

## 2014-10-27 VITALS — BP 136/86 | Temp 98.4°F | Ht 65.0 in | Wt 112.3 lb

## 2014-10-27 DIAGNOSIS — M545 Low back pain, unspecified: Secondary | ICD-10-CM | POA: Insufficient documentation

## 2014-10-27 DIAGNOSIS — M5440 Lumbago with sciatica, unspecified side: Secondary | ICD-10-CM

## 2014-10-27 DIAGNOSIS — M810 Age-related osteoporosis without current pathological fracture: Secondary | ICD-10-CM

## 2014-10-27 NOTE — Telephone Encounter (Signed)
Patient would like to know if you can find when she had her last Prolia injection?? She can't remember and doesn't want to fall behind on it.

## 2014-10-27 NOTE — Patient Instructions (Signed)
X ray today  If ok  An no fracrure  Then  We can try prednisone  To see if calms down.

## 2014-10-27 NOTE — Progress Notes (Signed)
Pre visit review using our clinic review tool, if applicable. No additional management support is needed unless otherwise documented below in the visit note.   Chief Complaint  Patient presents with  . Follow-up  . Back Pain    new onset    HPI: Patient Adriana Figueroa  comes in today for SDA for  new problem evaluation.   Onset   Last Friday of new progressive back pain and less area without associated injury or falling or radiation. She laid down which usually helps in the fetal position to relieve the pain on her back  But it didn't work.   Still bothering her  Then aleve every day .  No changes .  Tylenol Tried topical Voltaren also with no relief. Changing her gait fever or specific injury He has known degenerative disease but no surgical intervention. She has begun on proolia  for her bones.  Doesn't want to take narcotics because of side effects ROS: See pertinent positives and negatives per HPI.  Past Medical History  Diagnosis Date  . GERD (gastroesophageal reflux disease)     on nexium since 1989 ? had esoph ulceration reported egd not in record  . History of esophageal ulcer   . Hyperlipidemia   . Osteoporosis     by dexa in the past failed orals has GERD last dexa 4/11 -1.4, -1.5 relast Jan 2010  . Fracture     hx in the past  . 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 Primiparous   . DJD (degenerative joint disease)     MRI spine DJD 2011  ack surgery consult.  . Depression     [facility]  . Sleep disorder     decreased  . B12 deficiency     gives her own shots  . Fibromyalgia     ? who made this dx. ? rheum?  Marland Kitchen. Arthritis   . Dislocated shoulder     2008, Constant shoulder pain   . Hypothyroidism     Family History  Problem Relation Age of Onset  . Hypertension Mother   . Osteoporosis Mother   . Kidney failure Mother   . Aneurysm Father     Brain    History   Social History  . Marital Status: Divorced    Spouse Name: N/A      Number of Children: 0  . Years of Education: BS and MBA   Social History Main Topics  . Smoking status: Never Smoker   . Smokeless tobacco: None  . Alcohol Use: Yes     Comment: OCCASIONAL  . Drug Use: No  . Sexual Activity: None   Other Topics Concern  . 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    Outpatient Encounter Prescriptions as of 10/27/2014  Medication Sig  . Calcium Citrate-Vitamin D (CITRACAL + D PO) Take 1 tablet by mouth daily.  . cyanocobalamin (,VITAMIN B-12,) 1000 MCG/ML injection Inject 1 mL (1,000 mcg total) into the muscle once.  Marland Kitchen. FOLIC ACID PO Take 1 tablet by mouth daily.  Marland Kitchen. gabapentin (NEURONTIN)  800 MG tablet Take 1 tablet (800 mg total) by mouth 4 (four) times daily.  Marland Kitchen. MAGNESIUM PO Take 1 tablet by mouth daily.  . Multiple Vitamin (MULTIVITAMIN WITH MINERALS) TABS tablet Take 1 tablet by mouth daily.  . Omega-3 Fatty Acids (FISH OIL PO) Take 1 capsule by mouth daily.  . pantoprazole (PROTONIX) 40 MG tablet Take 1 tablet (40 mg total) by mouth daily.  Marland Kitchen. triamterene-hydrochlorothiazide (MAXZIDE-25) 37.5-25 MG per tablet Take 0.5 tablets by mouth daily. For  5 days  Or as directed    EXAM:  BP 136/86 mmHg  Temp(Src) 98.4 F (36.9 C)  Ht 5\' 5"  (1.651 m)  Wt 112 lb 4.8 oz (50.939 kg)  BMI 18.69 kg/m2  Body mass index is 18.69 kg/(m^2).  GENERAL: vitals reviewed and listed above, alert, oriented, appears well hydrated and in no acute distress walks with a cane dependent rise from chair with some difficulty has to maintain her balance but does okay bu self  well-groomed HEENT: atraumatic, conjunctiva  clear, no obvious abnormalities on inspection of  external nose and ears OMS: moves all extremities    Localization of pain at the LS spine tenderness is midline no radiation Difficult to assess lower extremities because of her neuropathy weakness but appears to be the same as in the past no unusual rashes PSYCH: pleasant and cooperative, no obvious depression or anxiety  ASSESSMENT AND PLAN:  Discussed the following assessment and plan:  Midline low back pain without sciatica - progressive or newer onset  .  djd problematic   I agree not to use opiates  disc prednisine risk benefit  bones can call in if x ray shows just djd   Low back pain with sciatica, sciatica laterality unspecified, unspecified back pain laterality - Plan: DG Lumbar Spine Complete  Osteoporosis - Check x-ray rule out compression fracture  has begun prolia  - Plan: DG Lumbar Spine Complete Avoiding nsaids cause of hx of gi issues   (Did not discuss poss of celebrex at visit )  Risk benefit of pred.  medication discussed.  -Patient advised to return or notify health care team  if symptoms worsen ,persist or new concerns arise.  Patient Instructions  X ray today  If ok  An no fracrure  Then  We can try prednisone  To see if calms down.     Neta MendsWanda K. Panosh M.D.

## 2014-10-30 NOTE — Telephone Encounter (Signed)
Pt had last Prolia injection on 06/30/14.  Injection is due every 6 months. Now due.   She has an appt on 11/03/14 for her B12 injection.  Will do at that time. Tried to reach the pt to inform her.  No voicemail on home phone.

## 2014-10-31 NOTE — Telephone Encounter (Signed)
Error.  Due in January.  Pt notified.  Can be done at the same time as her B12 injection.

## 2014-11-01 ENCOUNTER — Encounter: Payer: Self-pay | Admitting: Neurology

## 2014-11-02 ENCOUNTER — Encounter: Payer: Self-pay | Admitting: Neurology

## 2014-11-03 ENCOUNTER — Ambulatory Visit (INDEPENDENT_AMBULATORY_CARE_PROVIDER_SITE_OTHER): Payer: 59 | Admitting: Family Medicine

## 2014-11-03 DIAGNOSIS — D519 Vitamin B12 deficiency anemia, unspecified: Secondary | ICD-10-CM

## 2014-11-03 MED ORDER — CYANOCOBALAMIN 1000 MCG/ML IJ SOLN
1000.0000 ug | Freq: Once | INTRAMUSCULAR | Status: AC
  Administered 2014-11-03: 1000 ug via INTRAMUSCULAR

## 2014-11-07 ENCOUNTER — Encounter: Payer: Self-pay | Admitting: Neurology

## 2014-11-17 ENCOUNTER — Ambulatory Visit (INDEPENDENT_AMBULATORY_CARE_PROVIDER_SITE_OTHER): Payer: 59 | Admitting: Family Medicine

## 2014-11-17 DIAGNOSIS — D519 Vitamin B12 deficiency anemia, unspecified: Secondary | ICD-10-CM

## 2014-11-17 MED ORDER — CYANOCOBALAMIN 1000 MCG/ML IJ SOLN
1000.0000 ug | Freq: Once | INTRAMUSCULAR | Status: AC
  Administered 2014-11-17: 1000 ug via INTRAMUSCULAR

## 2014-12-01 ENCOUNTER — Ambulatory Visit: Payer: 59 | Admitting: Family Medicine

## 2014-12-10 ENCOUNTER — Other Ambulatory Visit: Payer: Self-pay | Admitting: Internal Medicine

## 2014-12-11 NOTE — Telephone Encounter (Signed)
Sent to the pharmacy by e-scribe. 

## 2014-12-13 ENCOUNTER — Ambulatory Visit (INDEPENDENT_AMBULATORY_CARE_PROVIDER_SITE_OTHER): Payer: 59 | Admitting: Family Medicine

## 2014-12-13 DIAGNOSIS — E538 Deficiency of other specified B group vitamins: Secondary | ICD-10-CM

## 2014-12-13 MED ORDER — CYANOCOBALAMIN 1000 MCG/ML IJ SOLN
1000.0000 ug | Freq: Once | INTRAMUSCULAR | Status: AC
  Administered 2014-12-13: 1000 ug via INTRAMUSCULAR

## 2014-12-27 ENCOUNTER — Ambulatory Visit: Payer: 59 | Admitting: Family Medicine

## 2015-01-01 ENCOUNTER — Encounter: Payer: Self-pay | Admitting: Internal Medicine

## 2015-01-01 ENCOUNTER — Ambulatory Visit (INDEPENDENT_AMBULATORY_CARE_PROVIDER_SITE_OTHER): Payer: 59 | Admitting: Internal Medicine

## 2015-01-01 VITALS — BP 142/84 | HR 73 | Temp 97.8°F

## 2015-01-01 DIAGNOSIS — D519 Vitamin B12 deficiency anemia, unspecified: Secondary | ICD-10-CM

## 2015-01-01 DIAGNOSIS — G629 Polyneuropathy, unspecified: Secondary | ICD-10-CM | POA: Diagnosis not present

## 2015-01-01 DIAGNOSIS — R7989 Other specified abnormal findings of blood chemistry: Secondary | ICD-10-CM

## 2015-01-01 DIAGNOSIS — R945 Abnormal results of liver function studies: Secondary | ICD-10-CM

## 2015-01-01 DIAGNOSIS — R0602 Shortness of breath: Secondary | ICD-10-CM | POA: Diagnosis not present

## 2015-01-01 LAB — CBC WITH DIFFERENTIAL/PLATELET
Basophils Absolute: 0 10*3/uL (ref 0.0–0.1)
Basophils Relative: 0.6 % (ref 0.0–3.0)
EOS ABS: 0.1 10*3/uL (ref 0.0–0.7)
EOS PCT: 1 % (ref 0.0–5.0)
HCT: 42 % (ref 36.0–46.0)
Hemoglobin: 13.9 g/dL (ref 12.0–15.0)
LYMPHS PCT: 27.5 % (ref 12.0–46.0)
Lymphs Abs: 1.5 10*3/uL (ref 0.7–4.0)
MCHC: 33 g/dL (ref 30.0–36.0)
MCV: 98.1 fl (ref 78.0–100.0)
MONO ABS: 0.8 10*3/uL (ref 0.1–1.0)
Monocytes Relative: 14 % — ABNORMAL HIGH (ref 3.0–12.0)
NEUTROS ABS: 3.1 10*3/uL (ref 1.4–7.7)
Neutrophils Relative %: 56.9 % (ref 43.0–77.0)
Platelets: 195 10*3/uL (ref 150.0–400.0)
RBC: 4.29 Mil/uL (ref 3.87–5.11)
RDW: 15 % (ref 11.5–15.5)
WBC: 5.4 10*3/uL (ref 4.0–10.5)

## 2015-01-01 LAB — IBC PANEL
Iron: 79 ug/dL (ref 42–145)
Saturation Ratios: 22.6 % (ref 20.0–50.0)
TRANSFERRIN: 250 mg/dL (ref 212.0–360.0)

## 2015-01-01 LAB — BASIC METABOLIC PANEL
BUN: 14 mg/dL (ref 6–23)
CO2: 26 mEq/L (ref 19–32)
CREATININE: 0.53 mg/dL (ref 0.40–1.20)
Calcium: 9.2 mg/dL (ref 8.4–10.5)
Chloride: 99 mEq/L (ref 96–112)
GFR: 124.12 mL/min (ref >60.00)
GLUCOSE: 108 mg/dL — AB (ref 70–99)
POTASSIUM: 4.8 meq/L (ref 3.5–5.1)
Sodium: 135 mEq/L (ref 135–145)

## 2015-01-01 LAB — HEPATIC FUNCTION PANEL
ALBUMIN: 3.9 g/dL (ref 3.5–5.2)
ALT: 41 U/L — AB (ref 0–35)
AST: 54 U/L — ABNORMAL HIGH (ref 0–37)
Alkaline Phosphatase: 45 U/L (ref 39–117)
BILIRUBIN DIRECT: 0.3 mg/dL (ref 0.0–0.3)
Total Bilirubin: 0.7 mg/dL (ref 0.2–1.2)
Total Protein: 6.5 g/dL (ref 6.0–8.3)

## 2015-01-01 LAB — BRAIN NATRIURETIC PEPTIDE: PRO B NATRI PEPTIDE: 66 pg/mL (ref 0.0–100.0)

## 2015-01-01 LAB — TSH: TSH: 3.27 u[IU]/mL (ref 0.35–4.50)

## 2015-01-01 LAB — FERRITIN: Ferritin: 423.3 ng/mL — ABNORMAL HIGH (ref 10.0–291.0)

## 2015-01-01 NOTE — Progress Notes (Signed)
Pre visit review using our clinic review tool, if applicable. No additional management support is needed unless otherwise documented below in the visit note.  Chief Complaint  Patient presents with  . Shortness of Breath  . Sweating  . Nasal Congestion    HPI: Patient Adriana Figueroa  comes in today for SDA for  new problem evaluation. Onset  Wednesday  5 days ago spell happened  Coming back  In kitchen  Sob and profuse sweating  And then episode sun and sweating profusely  And felt badly rested coming back for ep[isodic sob lasted 30minutes  .felt faint.bu no racing heart  Neuro sx.   Fell 3 x on Saturday  From knee  Not assoc  And then had  Saturday  Episode.   And sweating. Lasted  6:30 - 10 .  Almost called ems  No cough hemoptysis new chest pain  Ask about red area forehead since this started no pain or itching ROS: See pertinent positives and negatives per HPI. Ongoing  Edema  No change  See eval and neg dopplers last year  Diuretic no help with swelling  On gabapentin and wants to stop cause of se such as dry mouth. No vomiting aite normal this am.  Neg etoh caffiene new meds   Past Medical History  Diagnosis Date  . GERD (gastroesophageal reflux disease)     on nexium since 1989 ? had esoph ulceration reported egd not in record  . History of esophageal ulcer   . Hyperlipidemia   . Osteoporosis     by dexa in the past failed orals has GERD last dexa 4/11 -1.4, -1.5 relast Jan 2010  . Fracture     hx in the past  . 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 Primiparous   . DJD (degenerative joint disease)     MRI spine DJD 2011  ack surgery consult.  . Depression     [facility]  . Sleep disorder     decreased  . B12 deficiency     gives her own shots  . Fibromyalgia     ? who made this dx. ? rheum?  Marland Kitchen. Arthritis   . Dislocated shoulder     2008, Constant shoulder pain   . Hypothyroidism     Family History  Problem Relation Age of Onset  .  Hypertension Mother   . Osteoporosis Mother   . Kidney failure Mother   . Aneurysm Father     Brain    History   Social History  . Marital Status: Divorced    Spouse Name: N/A    Number of Children: 0  . Years of Education: BS and MBA   Social History Main Topics  . Smoking status: Never Smoker   . Smokeless tobacco: Not on file  . Alcohol Use: Yes     Comment: OCCASIONAL  . Drug Use: No  . Sexual Activity: Not on file   Other Topics Concern  . 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    Outpatient Encounter Prescriptions as of  01/01/2015  Medication Sig  . Calcium Citrate-Vitamin D (CITRACAL + D PO) Take 1 tablet by mouth daily.  . cyanocobalamin (,VITAMIN B-12,) 1000 MCG/ML injection Inject 1 mL (1,000 mcg total) into the muscle once.  Marland Kitchen FOLIC ACID PO Take 1 tablet by mouth daily.  Marland Kitchen gabapentin (NEURONTIN) 800 MG tablet Take 1 tablet (800 mg total) by mouth 4 (four) times daily.  Marland Kitchen MAGNESIUM PO Take 1 tablet by mouth daily.  . Multiple Vitamin (MULTIVITAMIN WITH MINERALS) TABS tablet Take 1 tablet by mouth daily.  . Omega-3 Fatty Acids (FISH OIL PO) Take 1 capsule by mouth daily.  . pantoprazole (PROTONIX) 40 MG tablet TAKE ONE TABLET BY MOUTH ONCE DAILY  . triamterene-hydrochlorothiazide (MAXZIDE-25) 37.5-25 MG per tablet Take 0.5 tablets by mouth daily. For  5 days  Or as directed    EXAM:  BP 142/84 mmHg  Pulse 73  Temp(Src) 97.8 F (36.6 C) (Oral)  Wt   SpO2 98%  Body mass index is 0.00 kg/(m^2).  GENERAL: vitals reviewed and listed above, alert, oriented, appears well hydrated and in no acute distress  midl anxiety walks slowly with cane flat footed  But  independent HEENT: atraumatic, conjunctiva  clear, no obvious abnormalities on inspection of external nose and ears  Mild congestion ears nl.  NECK: no obvious masses on inspection palpation  LUNGS: clear to auscultation bilaterally, no wheezes, rales or rhonchi, good air movement CV: HRRR, no clubbing cyanosis minimal   peripheral edema nl cap refill  Left more than right no redness  MS: moves all extremities  PSYCH:  and cooperative,  mildly stressed and anxious Lab Results  Component Value Date   WBC 5.4 01/01/2015   HGB 13.9 01/01/2015   HCT 42.0 01/01/2015   PLT 195.0 01/01/2015   GLUCOSE 108* 01/01/2015   CHOL 337* 08/08/2014   TRIG 166.0* 08/08/2014   HDL 131.00 08/08/2014   LDLDIRECT 120.5 07/30/2011   LDLCALC 173* 08/08/2014   ALT 41* 01/01/2015   AST 54* 01/01/2015   NA 135 01/01/2015   K 4.8 01/01/2015   CL 99 01/01/2015   CREATININE 0.53 01/01/2015   BUN 14 01/01/2015   CO2 26 01/01/2015   TSH 3.27 01/01/2015   INR 0.88 09/20/2010   HGBA1C 5.6 04/17/2014    ASSESSMENT AND PLAN:  Discussed the following assessment and plan:  Shortness of breath - episodic with profuse sweating  uncertain cause  pulse ox good today d dimer   - Plan: CBC with Differential, Basic metabolic panel, TSH, Hepatic function panel, D-dimer, Quantitative, Brain natriuretic peptide, DG Chest 2 View  Abnormal liver function test - hx of same review record and fu labs  minimal etoh or meds has high lipids fu labs serologies and consider [place] as apparently not done  in past - Plan: Hepatitis C antibody, Hepatitis B surface antigen, Hepatitis B surface antibody, Hepatitis B core antibody, total, Ferritin, IBC panel, Ceruloplasmin, ANA  Anemia, B12 deficiency  Neuropathy - able to walk at this time but still problematic  - Plan: Hepatitis C antibody, Hepatitis B surface antigen, Hepatitis B surface antibody, Hepatitis B core antibody, total, Ferritin, IBC panel, Ceruloplasmin, ANA Due for blood  tests related to abn lfts    Wants to wean nuerotin for  Se dry mouth do slowly could be adding to edema but no sob. ? if some anxiety but this seems different   Labs today stat x ray  Says may not get there today cause of transportaion  Referral depending  Couldn't get ekg today   .  Couldn't get on table . ( not a new problem )heart is RR and exam nl.) -Patient advised to return or notify health care team  if symptoms worsen ,persist or new concerns arise.  Patient Instructions  Your oxygen level is good but  I am concerned that you had shortness of breath epeisodes and advise evluation of heart and lung function. Blood tests today Chest x ray and  Poss cardiology appt  If gets bad again call ems to go to  the ED for eval. The swelling in legs could be from the neuropathy and meds  And doesn't.  seem to respond to the diuretic .  Will notify you  of labs when available.      Neta Mends. Cherica Heiden M.D.

## 2015-01-01 NOTE — Patient Instructions (Signed)
Your oxygen level is good but  I am concerned that you had shortness of breath epeisodes and advise evluation of heart and lung function. Blood tests today Chest x ray and  Poss cardiology appt  If gets bad again call ems to go to  the ED for eval. The swelling in legs could be from the neuropathy and meds  And doesn't.  seem to respond to the diuretic .  Will notify you  of labs when available.

## 2015-01-02 ENCOUNTER — Other Ambulatory Visit: Payer: Self-pay | Admitting: Family Medicine

## 2015-01-02 ENCOUNTER — Inpatient Hospital Stay: Admission: RE | Admit: 2015-01-02 | Payer: Self-pay | Source: Ambulatory Visit

## 2015-01-02 ENCOUNTER — Ambulatory Visit (INDEPENDENT_AMBULATORY_CARE_PROVIDER_SITE_OTHER)
Admission: RE | Admit: 2015-01-02 | Discharge: 2015-01-02 | Disposition: A | Discharge status: Home / Self Care | Payer: 59 | Source: Ambulatory Visit | Attending: Internal Medicine | Admitting: Internal Medicine

## 2015-01-02 DIAGNOSIS — R791 Abnormal coagulation profile: Secondary | ICD-10-CM

## 2015-01-02 DIAGNOSIS — R7989 Other specified abnormal findings of blood chemistry: Secondary | ICD-10-CM

## 2015-01-02 DIAGNOSIS — R0602 Shortness of breath: Secondary | ICD-10-CM

## 2015-01-02 LAB — HEPATITIS B SURFACE ANTIGEN: Hepatitis B Surface Ag: NEGATIVE

## 2015-01-02 LAB — HEPATITIS B CORE ANTIBODY, TOTAL: Hep B Core Total Ab: NONREACTIVE

## 2015-01-02 LAB — HEPATITIS B SURFACE ANTIBODY,QUALITATIVE: Hep B S Ab: NEGATIVE

## 2015-01-02 LAB — ANA: Anti Nuclear Antibody(ANA): NEGATIVE

## 2015-01-02 LAB — HEPATITIS C ANTIBODY: HCV AB: NEGATIVE

## 2015-01-02 LAB — D-DIMER, QUANTITATIVE: D-Dimer, Quant: 0.72 ug/mL-FEU — ABNORMAL HIGH (ref 0.00–0.48)

## 2015-01-02 MED ORDER — IOHEXOL 350 MG/ML SOLN
80.0000 mL | Freq: Once | INTRAVENOUS | Status: AC | PRN
  Administered 2015-01-02: 80 mL via INTRAVENOUS

## 2015-01-03 ENCOUNTER — Observation Stay (HOSPITAL_COMMUNITY): Payer: 59

## 2015-01-03 ENCOUNTER — Telehealth: Payer: Self-pay | Admitting: Internal Medicine

## 2015-01-03 ENCOUNTER — Other Ambulatory Visit: Payer: Self-pay | Admitting: Family Medicine

## 2015-01-03 ENCOUNTER — Observation Stay (HOSPITAL_COMMUNITY)
Admission: EM | Admit: 2015-01-03 | Discharge: 2015-01-04 | Disposition: A | Discharge status: Home / Self Care | Payer: 59 | Attending: Internal Medicine | Admitting: Internal Medicine

## 2015-01-03 ENCOUNTER — Encounter (HOSPITAL_COMMUNITY): Payer: Self-pay

## 2015-01-03 DIAGNOSIS — R0602 Shortness of breath: Secondary | ICD-10-CM | POA: Diagnosis present

## 2015-01-03 DIAGNOSIS — E785 Hyperlipidemia, unspecified: Secondary | ICD-10-CM | POA: Insufficient documentation

## 2015-01-03 DIAGNOSIS — Z8781 Personal history of (healed) traumatic fracture: Secondary | ICD-10-CM | POA: Diagnosis not present

## 2015-01-03 DIAGNOSIS — Z880 Allergy status to penicillin: Secondary | ICD-10-CM | POA: Insufficient documentation

## 2015-01-03 DIAGNOSIS — I10 Essential (primary) hypertension: Secondary | ICD-10-CM | POA: Insufficient documentation

## 2015-01-03 DIAGNOSIS — G8929 Other chronic pain: Secondary | ICD-10-CM | POA: Diagnosis not present

## 2015-01-03 DIAGNOSIS — G479 Sleep disorder, unspecified: Secondary | ICD-10-CM | POA: Insufficient documentation

## 2015-01-03 DIAGNOSIS — M797 Fibromyalgia: Secondary | ICD-10-CM | POA: Insufficient documentation

## 2015-01-03 DIAGNOSIS — R064 Hyperventilation: Secondary | ICD-10-CM | POA: Insufficient documentation

## 2015-01-03 DIAGNOSIS — G629 Polyneuropathy, unspecified: Secondary | ICD-10-CM | POA: Insufficient documentation

## 2015-01-03 DIAGNOSIS — R079 Chest pain, unspecified: Secondary | ICD-10-CM | POA: Diagnosis not present

## 2015-01-03 DIAGNOSIS — F329 Major depressive disorder, single episode, unspecified: Secondary | ICD-10-CM | POA: Diagnosis not present

## 2015-01-03 DIAGNOSIS — Z79899 Other long term (current) drug therapy: Secondary | ICD-10-CM | POA: Insufficient documentation

## 2015-01-03 DIAGNOSIS — M810 Age-related osteoporosis without current pathological fracture: Secondary | ICD-10-CM | POA: Diagnosis not present

## 2015-01-03 DIAGNOSIS — Z872 Personal history of diseases of the skin and subcutaneous tissue: Secondary | ICD-10-CM | POA: Diagnosis not present

## 2015-01-03 DIAGNOSIS — R0789 Other chest pain: Secondary | ICD-10-CM

## 2015-01-03 DIAGNOSIS — I2584 Coronary atherosclerosis due to calcified coronary lesion: Principal | ICD-10-CM

## 2015-01-03 DIAGNOSIS — K219 Gastro-esophageal reflux disease without esophagitis: Secondary | ICD-10-CM | POA: Insufficient documentation

## 2015-01-03 DIAGNOSIS — E538 Deficiency of other specified B group vitamins: Secondary | ICD-10-CM | POA: Diagnosis not present

## 2015-01-03 DIAGNOSIS — R52 Pain, unspecified: Secondary | ICD-10-CM

## 2015-01-03 DIAGNOSIS — F419 Anxiety disorder, unspecified: Secondary | ICD-10-CM | POA: Diagnosis not present

## 2015-01-03 DIAGNOSIS — M1990 Unspecified osteoarthritis, unspecified site: Secondary | ICD-10-CM | POA: Insufficient documentation

## 2015-01-03 DIAGNOSIS — R1310 Dysphagia, unspecified: Secondary | ICD-10-CM | POA: Diagnosis not present

## 2015-01-03 DIAGNOSIS — R0609 Other forms of dyspnea: Secondary | ICD-10-CM | POA: Diagnosis present

## 2015-01-03 DIAGNOSIS — I2510 Atherosclerotic heart disease of native coronary artery without angina pectoris: Secondary | ICD-10-CM

## 2015-01-03 DIAGNOSIS — E039 Hypothyroidism, unspecified: Secondary | ICD-10-CM | POA: Insufficient documentation

## 2015-01-03 HISTORY — DX: Essential (primary) hypertension: I10

## 2015-01-03 HISTORY — DX: Polyneuropathy, unspecified: G62.9

## 2015-01-03 HISTORY — DX: Dorsalgia, unspecified: M54.9

## 2015-01-03 HISTORY — DX: Other chronic pain: G89.29

## 2015-01-03 LAB — URINALYSIS, ROUTINE W REFLEX MICROSCOPIC
BILIRUBIN URINE: NEGATIVE
GLUCOSE, UA: NEGATIVE mg/dL
HGB URINE DIPSTICK: NEGATIVE
KETONES UR: NEGATIVE mg/dL
LEUKOCYTES UA: NEGATIVE
Nitrite: NEGATIVE
PH: 7.5 (ref 5.0–8.0)
Protein, ur: NEGATIVE mg/dL
Specific Gravity, Urine: 1.007 (ref 1.005–1.030)
Urobilinogen, UA: 0.2 mg/dL (ref 0.0–1.0)

## 2015-01-03 LAB — BASIC METABOLIC PANEL
Anion gap: 11 (ref 5–15)
BUN: 7 mg/dL (ref 6–23)
CALCIUM: 10.1 mg/dL (ref 8.4–10.5)
CO2: 22 mmol/L (ref 19–32)
CREATININE: 0.65 mg/dL (ref 0.50–1.10)
Chloride: 105 mEq/L (ref 96–112)
GFR calc Af Amer: >90 mL/min (ref >90)
GFR calc non Af Amer: >90 mL/min (ref >90)
GLUCOSE: 99 mg/dL (ref 70–99)
Potassium: 3.7 mmol/L (ref 3.5–5.1)
Sodium: 138 mmol/L (ref 135–145)

## 2015-01-03 LAB — I-STAT TROPONIN, ED: TROPONIN I, POC: 0 ng/mL (ref 0.00–0.08)

## 2015-01-03 LAB — CBC
HCT: 42 % (ref 36.0–46.0)
Hemoglobin: 14.6 g/dL (ref 12.0–15.0)
MCH: 32.8 pg (ref 26.0–34.0)
MCHC: 34.8 g/dL (ref 30.0–36.0)
MCV: 94.4 fL (ref 78.0–100.0)
PLATELETS: 245 10*3/uL (ref 150–400)
RBC: 4.45 MIL/uL (ref 3.87–5.11)
RDW: 14.2 % (ref 11.5–15.5)
WBC: 4.9 10*3/uL (ref 4.0–10.5)

## 2015-01-03 LAB — I-STAT ARTERIAL BLOOD GAS, ED
Acid-base deficit: 2 mmol/L (ref 0.0–2.0)
Bicarbonate: 18.6 mEq/L — ABNORMAL LOW (ref 20.0–24.0)
O2 SAT: 98 %
TCO2: 19 mmol/L (ref 0–100)
pCO2 arterial: 22.7 mmHg — ABNORMAL LOW (ref 35.0–45.0)
pH, Arterial: 7.522 — ABNORMAL HIGH (ref 7.350–7.450)
pO2, Arterial: 84 mmHg (ref 80.0–100.0)

## 2015-01-03 LAB — BRAIN NATRIURETIC PEPTIDE: B Natriuretic Peptide: 143.9 pg/mL — ABNORMAL HIGH (ref 0.0–100.0)

## 2015-01-03 LAB — CERULOPLASMIN: CERULOPLASMIN: 22 mg/dL (ref 18–53)

## 2015-01-03 MED ORDER — NITROGLYCERIN 0.4 MG SL SUBL
0.4000 mg | SUBLINGUAL_TABLET | SUBLINGUAL | Status: DC | PRN
  Administered 2015-01-03: 0.4 mg via SUBLINGUAL
  Filled 2015-01-03: qty 1

## 2015-01-03 MED ORDER — ADULT MULTIVITAMIN W/MINERALS CH
1.0000 | ORAL_TABLET | Freq: Every day | ORAL | Status: DC
  Administered 2015-01-03 – 2015-01-04 (×2): 1 via ORAL
  Filled 2015-01-03 (×2): qty 1

## 2015-01-03 MED ORDER — LORAZEPAM 0.5 MG PO TABS
0.5000 mg | ORAL_TABLET | Freq: Four times a day (QID) | ORAL | Status: DC | PRN
  Administered 2015-01-03 – 2015-01-04 (×2): 0.5 mg via ORAL
  Filled 2015-01-03 (×2): qty 1

## 2015-01-03 MED ORDER — ASPIRIN 81 MG PO CHEW
324.0000 mg | CHEWABLE_TABLET | Freq: Once | ORAL | Status: DC
  Filled 2015-01-03: qty 4

## 2015-01-03 MED ORDER — IPRATROPIUM-ALBUTEROL 0.5-2.5 (3) MG/3ML IN SOLN
3.0000 mL | Freq: Once | RESPIRATORY_TRACT | Status: AC
  Administered 2015-01-03: 3 mL via RESPIRATORY_TRACT
  Filled 2015-01-03: qty 3

## 2015-01-03 MED ORDER — ONDANSETRON HCL 4 MG/2ML IJ SOLN: 4.0000 mg | Freq: Four times a day (QID) | INTRAMUSCULAR | Status: DC | PRN

## 2015-01-03 MED ORDER — GABAPENTIN 800 MG PO TABS
800.0000 mg | ORAL_TABLET | Freq: Four times a day (QID) | ORAL | Status: DC
  Filled 2015-01-03: qty 1

## 2015-01-03 MED ORDER — ENOXAPARIN SODIUM 40 MG/0.4ML ~~LOC~~ SOLN
40.0000 mg | SUBCUTANEOUS | Status: DC
Start: 2015-01-03 — End: 2015-01-04
  Administered 2015-01-03: 40 mg via SUBCUTANEOUS
  Filled 2015-01-03: qty 0.4

## 2015-01-03 MED ORDER — FOLIC ACID 1 MG PO TABS
1.0000 mg | ORAL_TABLET | Freq: Every day | ORAL | Status: DC
  Administered 2015-01-03 – 2015-01-04 (×2): 1 mg via ORAL
  Filled 2015-01-03 (×2): qty 1

## 2015-01-03 MED ORDER — PANTOPRAZOLE SODIUM 40 MG PO TBEC
40.0000 mg | DELAYED_RELEASE_TABLET | Freq: Every day | ORAL | Status: DC
  Administered 2015-01-04: 40 mg via ORAL
  Filled 2015-01-03: qty 1

## 2015-01-03 MED ORDER — CYANOCOBALAMIN 1000 MCG/ML IJ SOLN
1000.0000 ug | Freq: Once | INTRAMUSCULAR | Status: AC
  Administered 2015-01-03: 1000 ug via INTRAMUSCULAR
  Filled 2015-01-03: qty 1

## 2015-01-03 MED ORDER — ONDANSETRON HCL 4 MG PO TABS: 4.0000 mg | ORAL_TABLET | Freq: Four times a day (QID) | ORAL | Status: DC | PRN

## 2015-01-03 MED ORDER — GABAPENTIN 400 MG PO CAPS
800.0000 mg | ORAL_CAPSULE | Freq: Four times a day (QID) | ORAL | Status: DC
  Administered 2015-01-03 – 2015-01-04 (×3): 800 mg via ORAL
  Filled 2015-01-03 (×3): qty 2

## 2015-01-03 MED ORDER — SODIUM CHLORIDE 0.9 % IJ SOLN
3.0000 mL | Freq: Two times a day (BID) | INTRAMUSCULAR | Status: DC
  Administered 2015-01-03 – 2015-01-04 (×2): 3 mL via INTRAVENOUS

## 2015-01-03 MED ORDER — LORAZEPAM 1 MG PO TABS
0.5000 mg | ORAL_TABLET | Freq: Once | ORAL | Status: AC
  Administered 2015-01-03: 0.5 mg via ORAL
  Filled 2015-01-03: qty 1

## 2015-01-03 MED ORDER — TRIAMTERENE-HCTZ 37.5-25 MG PO TABS: 0.5000 | ORAL_TABLET | Freq: Every day | ORAL | Status: DC

## 2015-01-03 MED ORDER — SODIUM CHLORIDE 0.9 % IV SOLN
INTRAVENOUS | Status: AC
  Administered 2015-01-03: 21:00:00 via INTRAVENOUS

## 2015-01-03 MED ORDER — ASPIRIN 325 MG PO TABS
325.0000 mg | ORAL_TABLET | Freq: Every day | ORAL | Status: DC
  Administered 2015-01-04: 325 mg via ORAL
  Filled 2015-01-03: qty 1

## 2015-01-03 NOTE — Telephone Encounter (Signed)
Pt requesting results of CT performed yesterday.  Pt also inquiring if she needs to have chest x-ray done as well.

## 2015-01-03 NOTE — Telephone Encounter (Signed)
Patient is currently in the ED.

## 2015-01-03 NOTE — ED Notes (Signed)
Pt here yesterday and had scans done, pt sts was called and told that shehad a blockae and if she did not come back she could die, hyperventilating on assessment, complains of trouble swallowing. Reports chest pain

## 2015-01-03 NOTE — ED Notes (Signed)
Patient refused to try and ambulate so I am unable to check her pulse ox while ambulating.

## 2015-01-03 NOTE — Consult Note (Signed)
Admit date: 01/03/2015 Referring Physician  Dr. York SpanielBuriev Primary Physician Lorretta HarpPANOSH,WANDA KOTVAN, MD Primary Cardiologist  None Reason for Consultation  Chest pain  HPI: 64 year old female with anxiety who presented to the emergency department with chest pain, dyspnea on exertion and apparently "someone called me to go to the ED because of blockage found on my CT scan ". Her CT was negative for pulmonary embolism. She did have coronary artery calcification.  Her chest pain is off and on, substernal, possibly associated with dizziness, palpitations, shortness of breath, anxiety. In the emergency room she needed benzodiazepine to help her with her hyperventilation.  She was also quite adamant that her lower extremities were swelling although on physical exam this was not very apparent. Trace ankle edema.    PMH:   Past Medical History  Diagnosis Date  . GERD (gastroesophageal reflux disease)     on nexium since 1989 ? had esoph ulceration reported egd not in record  . History of esophageal ulcer   . Hyperlipidemia   . Osteoporosis     by dexa in the past failed orals has GERD last dexa 4/11 -1.4, -1.5 relast Jan 2010  . Fracture     hx in the past  . 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 Primiparous   . DJD (degenerative joint disease)     MRI spine DJD 2011  ack surgery consult.  . Depression     [facility]  . Sleep disorder     decreased  . B12 deficiency     gives her own shots  . Fibromyalgia     ? who made this dx. ? rheum?  Marland Kitchen. Arthritis   . Dislocated shoulder     2008, Constant shoulder pain   . Hypothyroidism   . Chronic back pain   . Peripheral neuropathy     walks with cane, walker  . Hypertension     PSH:   Past Surgical History  Procedure Laterality Date  . Dislocated shoulder surgery 2008     Allergies:  Codeine; Penicillins; and Vicodin Prior to Admit Meds:   Prior to Admission medications   Medication Sig Start Date End Date  Taking? Authorizing Provider  Calcium Citrate-Vitamin D (CITRACAL + D PO) Take 1 tablet by mouth daily.   Yes Historical Provider, MD  celecoxib (CELEBREX) 200 MG capsule Take 200 mg by mouth 2 (two) times daily.   Yes Historical Provider, MD  cyanocobalamin (,VITAMIN B-12,) 1000 MCG/ML injection Inject 1 mL (1,000 mcg total) into the muscle once. Patient taking differently: Inject 1,000 mcg into the muscle See admin instructions.  06/08/14  Yes Madelin HeadingsWanda K Panosh, MD  FOLIC ACID PO Take 1 tablet by mouth daily.   Yes Historical Provider, MD  gabapentin (NEURONTIN) 800 MG tablet Take 1 tablet (800 mg total) by mouth 4 (four) times daily. 08/18/14  Yes Delia HeadyPramod Sethi, MD  MAGNESIUM PO Take 1 tablet by mouth daily.   Yes Historical Provider, MD  Multiple Vitamin (MULTIVITAMIN WITH MINERALS) TABS tablet Take 1 tablet by mouth daily.   Yes Historical Provider, MD  Omega-3 Fatty Acids (FISH OIL PO) Take 1 capsule by mouth daily.   Yes Historical Provider, MD  pantoprazole (PROTONIX) 40 MG tablet TAKE ONE TABLET BY MOUTH ONCE DAILY 12/11/14  Yes Madelin HeadingsWanda K Panosh, MD   Fam HX:    Family History  Problem Relation Age of Onset  . Hypertension Mother   . Osteoporosis Mother   .  Kidney failure Mother   . Aneurysm Father     Brain   Social HX:    History   Social History  . Marital Status: Divorced    Spouse Name: N/A    Number of Children: 0  . Years of Education: BS and MBA   Occupational History  . Not on file.   Social History Main Topics  . Smoking status: Never Smoker   . Smokeless tobacco: Not on file  . Alcohol Use: Yes     Comment: OCCASIONAL  . Drug Use: No  . Sexual Activity: Not on file   Other Topics Concern  . 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     ROS: Denies any fevers, chills, orthopnea, PND, syncope. No bleeding. All 11 ROS were addressed and are negative except what is stated in the HPI   Physical Exam: Blood pressure 119/71, pulse 75, temperature 97.8 F (36.6 C), temperature source Oral, resp. rate 20, height  (1.549 m), weight 110 lb (49.896 kg), SpO2 100 %.   General: Well developed, well nourished, anxious in no acute distress Head: Eyes PERRLA, No xanthomas.   Normal cephalic and atramatic  Lungs:   Clear bilaterally to auscultation and percussion. Normal respiratory effort. No wheezes, no rales. Heart:   HRRR S1 S2 Pulses are 2+ & equal. No murmur, rubs, gallops.  No carotid bruit. No JVD.  No abdominal bruits.  Abdomen: Bowel sounds are positive, abdomen soft and non-tender without masses. No hepatosplenomegaly. Msk:  Back normal. Normal strength and tone for age. Extremities:  No clubbing, cyanosis, mild ankle edema.  DP +1 Neuro: Alert and oriented X 3, non-focal, MAE x 4 GU: Deferred Rectal: Deferred Psych: Quite anxious      Labs: Lab Results  Component Value Date   WBC 4.9 01/03/2015   HGB 14.6 01/03/2015   HCT 42.0 01/03/2015   MCV 94.4 01/03/2015   PLT 245 01/03/2015     Recent Labs Lab 01/01/15 1038 01/03/15 1515  NA 135 138  K 4.8 3.7  CL 99 105  CO2 26 22  BUN 14 7  CREATININE 0.53 0.65  CALCIUM 9.2 10.1  PROT 6.5  --   BILITOT 0.7  --   ALKPHOS 45  --   ALT 41*  --   AST 54*  --   GLUCOSE 108* 99   No results for input(s): CKTOTAL, CKMB, TROPONINI in the last 72 hours. Lab Results  Component Value Date   CHOL 337* 08/08/2014   HDL 131.00 08/08/2014   LDLCALC 173* 08/08/2014   TRIG 166.0* 08/08/2014   Lab Results  Component Value Date   DDIMER 0.72* 01/01/2015     Radiology:  Dg Neck Soft Tissue  01/03/2015   CLINICAL DATA:  Dysphasia for 1 week.  EXAM: NECK SOFT TISSUES - 1+ VIEW   COMPARISON:  None.  FINDINGS: The epiglottis and aryepiglottic folds are normal. No abnormal prevertebral or retropharyngeal soft tissue swelling or abnormal air collections. Prominent lingual tonsils. Prominent anterior spurring from the cervical spine with large bridging osteophytes. The lung apices are  clear.  IMPRESSION: Normal epiglottis.  Prominent lingual tonsils.  Large bridging anterior osteophytes from the cervical spine could potentially impinge on the esophagus.   Electronically Signed   By: Loralie Champagne M.D.   On: 01/03/2015 16:59   Dg Chest 2 View  01/03/2015   CLINICAL DATA:  Dysphagia for 1 week, pain with inspiration  EXAM: CHEST  2 VIEW  COMPARISON:  CT chest 01/02/2015  FINDINGS: Cardiomediastinal silhouette is stable. No acute infiltrate or pleural effusion. No pulmonary edema. Minimal degenerative changes thoracic spine.  IMPRESSION: No active cardiopulmonary disease.   Electronically Signed   By: Natasha Mead M.D.   On: 01/03/2015 16:57   Ct Angio Chest Pe W/cm &/or Wo Cm  01/02/2015   CLINICAL DATA:  Shortness of breath for 6 days  EXAM: CT ANGIOGRAPHY CHEST WITH CONTRAST  TECHNIQUE: Multidetector CT imaging of the chest was performed using the standard protocol during bolus administration of intravenous contrast. Multiplanar CT image reconstructions and MIPs were obtained to evaluate the vascular anatomy.  CONTRAST:  80 mL Omnipaque 350.  COMPARISON:  None.  FINDINGS: The lungs are well aerated bilaterally without focal infiltrate or sizable effusion. No parenchymal nodules are seen.  The thoracic inlet is within normal limits. Mild calcification of the thoracic aorta is seen without aneurysmal dilatation or dissection.  Pulmonary artery is well visualized and demonstrates a normal branching pattern bilaterally. No filling defects to suggest pulmonary emboli are noted. No hilar or mediastinal adenopathy is seen. Coronary calcifications are noted. The cardiac structures are otherwise  within normal limits. The upper abdomen reveals no acute abnormality. No acute bony abnormality is noted. Opacification of multiple chest wall venous collaterals are noted likely related to the pressure of the injection.  Review of the MIP images confirms the above findings.  IMPRESSION: No evidence of pulmonary emboli.  No acute abnormality is seen.  These results will be called to the ordering clinician or representative by the Radiologist Assistant, and communication documented in the PACS or zVision Dashboard.   Electronically Signed   By: Alcide Clever M.D.   On: 01/02/2015 15:26   Personally viewed.  EKG: Sinus rhythm, baseline wander, no ST segment changes   Personally viewed.   ASSESSMENT/PLAN:    64 year old female with anxiety, prior depression here with chest discomfort after being evaluated for dyspnea with CT scan which was negative for pulmonary embolism. Coronary artery calcification was noted.  Atypical chest pain  - Nothing by mouth past midnight for nuclear stress test  - This will allow [place] to assess for ischemia. Coronary calcium seen on CT scan. Nuclear stress will show [place] ejection fraction as well.  Dyspnea  - She appears to be hyperventilating. Thankfully, no evidence of pulmonary embolism on CT scan.  Anxiety  - Per primary team  - This could be driving most of her symptoms. She was highly concerned as well about swelling in her feet. She demonstrated trace ankle edema. She was quite adamant that she was swollen.  Donato Schultz, MD  01/03/2015  5:53 PM

## 2015-01-03 NOTE — Telephone Encounter (Signed)
Patient notified of results. See result note.  

## 2015-01-03 NOTE — Telephone Encounter (Signed)
Pt was seen on 1-18 for SOB. Pt is aware deb will sch cardiologist appt. Pt is having SOB now and decline to talk with [facility]. Pt would like misty to call her back

## 2015-01-03 NOTE — ED Provider Notes (Signed)
CSN: [id number]     Arrival date & time 01/03/15  1440 History   First MD Initiated Contact with Patient 01/03/15 1459     Chief Complaint  Patient presents with  . Shortness of Breath      HPI Pt was seen at 1500. Per pt, c/o gradual onset and persistence of constant SOB for the past 1 week. Pt states she has been having several intermittent episodes of DOE, which has been associated with "excessive" diaphoresis. Symptoms lasted to 1 hour each episode before spontaneously resolving. States her symptoms include mid-sternal chest "pain," palpitations, "dizziness," "trouble swallowing," and bilateral lower extremity "swelling." Endorses hx of anxiety and "stress," but believes these symptoms are not consistent with her usual anxiety. Pt denies sore throat, no fevers, no cough/wheezing, no abd pain, no N/V/D, no back pain.    Past Medical History  Diagnosis Date  . GERD (gastroesophageal reflux disease)     on nexium since 1989 ? had esoph ulceration reported egd not in record  . History of esophageal ulcer   . Hyperlipidemia   . Osteoporosis     by dexa in the past failed orals has GERD last dexa 4/11 -1.4, -1.5 relast Jan 2010  . Fracture     hx in the past  . 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 Primiparous   . DJD (degenerative joint disease)     MRI spine DJD 2011  ack surgery consult.  . Depression     [facility]  . Sleep disorder     decreased  . B12 deficiency     gives her own shots  . Fibromyalgia     ? who made this dx. ? rheum?  Marland Kitchen Arthritis   . Dislocated shoulder     2008, Constant shoulder pain   . Hypothyroidism   . Chronic back pain   . Peripheral neuropathy     walks with cane, walker  . Hypertension    Past Surgical History  Procedure Laterality Date  . Dislocated shoulder surgery 2008     Family History  Problem Relation Age of Onset  . Hypertension Mother   . Osteoporosis Mother   . Kidney failure Mother   .  Aneurysm Father     Brain   History  Substance Use Topics  . Smoking status: Never Smoker   . Smokeless tobacco: Not on file  . Alcohol Use: Yes     Comment: OCCASIONAL    Review of Systems ROS: Statement: All systems negative except as marked or noted in the HPI; Constitutional: Negative for fever and chills. ; ; Eyes: Negative for eye pain, redness and discharge. ; ; ENMT: +dyspnea. Negative for ear pain, hoarseness, nasal congestion, sinus pressure and sore throat. ; ; Cardiovascular: +DOE, diaphoresis, CP, palpitations. ; ; Respiratory: Negative for cough, wheezing and stridor. ; ; Gastrointestinal: Negative for nausea, vomiting, diarrhea, abdominal pain, blood in stool, hematemesis, jaundice and rectal bleeding. . ; ; Genitourinary: Negative for dysuria, flank pain and hematuria. ; ; Musculoskeletal: +"LLE edema." Negative for back pain and neck pain. Negative for swelling and trauma.; ; Skin: Negative for pruritus, rash, abrasions, blisters, bruising and skin lesion.; ; Neuro: Negative for headache, lightheadedness and neck stiffness. Negative for weakness, altered level of consciousness , altered mental status, extremity weakness, paresthesias, involuntary movement, seizure and syncope.      Allergies  Codeine; Penicillins; and Vicodin  Home Medications   Prior to Admission  medications   Medication Sig Start Date End Date Taking? Authorizing Provider  Calcium Citrate-Vitamin D (CITRACAL + D PO) Take 1 tablet by mouth daily.    Historical Provider, MD  cyanocobalamin (,VITAMIN B-12,) 1000 MCG/ML injection Inject 1 mL (1,000 mcg total) into the muscle once. 06/08/14   Madelin Headings, MD  FOLIC ACID PO Take 1 tablet by mouth daily.    Historical Provider, MD  gabapentin (NEURONTIN) 800 MG tablet Take 1 tablet (800 mg total) by mouth 4 (four) times daily. 08/18/14   Delia Heady, MD  MAGNESIUM PO Take 1 tablet by mouth daily.    Historical Provider, MD  Multiple Vitamin (MULTIVITAMIN WITH  MINERALS) TABS tablet Take 1 tablet by mouth daily.    Historical Provider, MD  Omega-3 Fatty Acids (FISH OIL PO) Take 1 capsule by mouth daily.    Historical Provider, MD  pantoprazole (PROTONIX) 40 MG tablet TAKE ONE TABLET BY MOUTH ONCE DAILY 12/11/14   Madelin Headings, MD  triamterene-hydrochlorothiazide (MAXZIDE-25) 37.5-25 MG per tablet Take 0.5 tablets by mouth daily. For  5 days  Or as directed 06/07/14   Madelin Headings, MD   BP 119/71 mmHg  Pulse 75  Temp(Src) 97.8 F (36.6 C) (Oral)  Resp 20  Ht  (1.549 m)  Wt 110 lb (49.896 kg)  BMI 20.80 kg/m2  SpO2 100% Physical Exam 1505: Physical examination:  Nursing notes reviewed; Vital signs and O2 SAT reviewed;  Constitutional: Well developed, Well nourished, Well hydrated, In no acute distress; Head:  Normocephalic, atraumatic; Eyes: EOMI, PERRL, No scleral icterus; ENMT: Mouth and pharynx normal, Mucous membranes moist; Neck: Supple, Full range of motion, No lymphadenopathy; Cardiovascular: Regular rate and rhythm, No gallop; Respiratory: Breath sounds clear & equal bilaterally, No wheezes. Speaking full sentences. Hyperventilating.; Chest: Nontender, Movement normal; Abdomen: Soft, Nontender, Nondistended, Normal bowel sounds; Genitourinary: No CVA tenderness; Extremities: Pulses normal, No tenderness, No deformity, no edema, No calf tenderness, edema or asymmetry.; Neuro: AA&Ox3, Major CN grossly intact.  Speech clear. No gross focal motor or sensory deficits in extremities.; Skin: Color normal, Warm, Dry.; Psych:  Anxious, easily agitated, labile, rapid pressured speech.    ED Course  Procedures     EKG Interpretation   Date/Time:  Wednesday January 03 2015 14:47:41 EST Ventricular Rate:  63 PR Interval:  164 QRS Duration: 66 QT Interval:  432 QTC Calculation: 442 R Axis:   12 Text Interpretation:  Sinus rhythm Low voltage, precordial leads Baseline  wander When compared with ECG of 03/26/2014 No significant change was  found  Confirmed by [facility]  MD, Nicholos Johns [phone]) on 01/03/2015 3:29:18 PM      MDM  MDM Reviewed: previous chart, nursing note and vitals Reviewed previous: labs, ECG and CT scan Interpretation: labs, ECG and x-ray     Results for orders placed or performed during the hospital encounter of 01/03/15  CBC  Result Value Ref Range   WBC 4.9 4.0 - 10.5 K/uL   RBC 4.45 3.87 - 5.11 MIL/uL   Hemoglobin 14.6 12.0 - 15.0 g/dL   HCT 60.4 54.0 - 98.1 %   MCV 94.4 78.0 - 100.0 fL   MCH 32.8 26.0 - 34.0 pg   MCHC 34.8 30.0 - 36.0 g/dL   RDW 19.1 47.8 - 29.5 %   Platelets 245 150 - 400 K/uL  Basic metabolic panel  Result Value Ref Range   Sodium 138 135 - 145 mmol/L   Potassium 3.7 3.5 - 5.1 mmol/L  Chloride 105 96 - 112 mEq/L   CO2 22 19 - 32 mmol/L   Glucose, Bld 99 70 - 99 mg/dL   BUN 7 6 - 23 mg/dL   Creatinine, Ser 1.610.65 0.50 - 1.10 mg/dL   Calcium 09.610.1 8.4 - 04.510.5 mg/dL   GFR calc non Af Amer >90 >90 mL/min   GFR calc Af Amer >90 >90 mL/min   Anion gap 11 5 - 15  BNP (order ONLY if patient complains of dyspnea/SOB AND you have documented it for THIS visit)  Result Value Ref Range   B Natriuretic Peptide 143.9 (H) 0.0 - 100.0 pg/mL  Urinalysis, Routine w reflex microscopic  Result Value Ref Range   Color, Urine YELLOW YELLOW   APPearance CLEAR CLEAR   Specific Gravity, Urine 1.007 1.005 - 1.030   pH 7.5 5.0 - 8.0   Glucose, UA NEGATIVE NEGATIVE mg/dL   Hgb urine dipstick NEGATIVE NEGATIVE   Bilirubin Urine NEGATIVE NEGATIVE   Ketones, ur NEGATIVE NEGATIVE mg/dL   Protein, ur NEGATIVE NEGATIVE mg/dL   Urobilinogen, UA 0.2 0.0 - 1.0 mg/dL   Nitrite NEGATIVE NEGATIVE   Leukocytes, UA NEGATIVE NEGATIVE  I-stat troponin, ED (not [facility])  Result Value Ref Range   Troponin i, poc 0.00 0.00 - 0.08 ng/mL   Comment 3          I-Stat arterial blood gas, ED  Result Value Ref Range   pH, Arterial 7.522 (H) 7.350 - 7.450   pCO2 arterial 22.7 (L) 35.0 - 45.0 mmHg   pO2, Arterial  84.0 80.0 - 100.0 mmHg   Bicarbonate 18.6 (L) 20.0 - 24.0 mEq/L   TCO2 19 0 - 100 mmol/L   O2 Saturation 98.0 %   Acid-base deficit 2.0 0.0 - 2.0 mmol/L   Collection site RADIAL, ALLEN'S TEST ACCEPTABLE    Drawn by RT    Sample type ARTERIAL    Dg Neck Soft Tissue 01/03/2015   CLINICAL DATA:  Dysphasia for 1 week.  EXAM: NECK SOFT TISSUES - 1+ VIEW  COMPARISON:  None.  FINDINGS: The epiglottis and aryepiglottic folds are normal. No abnormal prevertebral or retropharyngeal soft tissue swelling or abnormal air collections. Prominent lingual tonsils. Prominent anterior spurring from the cervical spine with large bridging osteophytes. The lung apices are clear.  IMPRESSION: Normal epiglottis.  Prominent lingual tonsils.  Large bridging anterior osteophytes from the cervical spine could potentially impinge on the esophagus.   Electronically Signed   By: Loralie ChampagneMark  Gallerani M.D.   On: 01/03/2015 16:59   Dg Chest 2 View 01/03/2015   CLINICAL DATA:  Dysphagia for 1 week, pain with inspiration  EXAM: CHEST  2 VIEW  COMPARISON:  CT chest 01/02/2015  FINDINGS: Cardiomediastinal silhouette is stable. No acute infiltrate or pleural effusion. No pulmonary edema. Minimal degenerative changes thoracic spine.  IMPRESSION: No active cardiopulmonary disease.   Electronically Signed   By: Natasha MeadLiviu  Pop M.D.   On: 01/03/2015 16:57   Ct Angio Chest Pe W/cm &/or Wo Cm 01/02/2015   CLINICAL DATA:  Shortness of breath for 6 days  EXAM: CT ANGIOGRAPHY CHEST WITH CONTRAST  TECHNIQUE: Multidetector CT imaging of the chest was performed using the standard protocol during bolus administration of intravenous contrast. Multiplanar CT image reconstructions and MIPs were obtained to evaluate the vascular anatomy.  CONTRAST:  80 mL Omnipaque 350.  COMPARISON:  None.  FINDINGS: The lungs are well aerated bilaterally without focal infiltrate or sizable effusion. No parenchymal nodules are seen.  The thoracic inlet is within normal limits. Mild  calcification of the thoracic aorta is seen without aneurysmal dilatation or dissection.  Pulmonary artery is well visualized and demonstrates a normal branching pattern bilaterally. No filling defects to suggest pulmonary emboli are noted. No hilar or mediastinal adenopathy is seen. Coronary calcifications are noted. The cardiac structures are otherwise within normal limits. The upper abdomen reveals no acute abnormality. No acute bony abnormality is noted. Opacification of multiple chest wall venous collaterals are noted likely related to the pressure of the injection.  Review of the MIP images confirms the above findings.  IMPRESSION: No evidence of pulmonary emboli.  No acute abnormality is seen.  These results will be called to the ordering clinician or representative by the Radiologist Assistant, and communication documented in the PACS or zVision Dashboard.   Electronically Signed   By: Alcide Clever M.D.   On: 01/02/2015 15:26     1615:  Since arrival to the ED, pt has been rude and yelling to multiple ED staff, as well as putting her fingers in her ears, screaming "you're all incompetent and don't know what you're doing!" and "can't you tell I can't breathe or even swallow?" Continues to insist PMD's office called her and told her she "was going to die." Pt extremely agitated, unable to re-direct. Refuses to participate with her plan of care, including refusing to slow her breathing down to control her hyperventilation, ambulate, or have orthostatic VS performed.  Pt has been given duoneb, ASA and SL ntg. Does endorse her CP is "getting better" after SL ntg, but continues to behave rudely to ED staff, hyperventilate, make herself gag, and stick her fingers in her ears saying "I'm not listening to what you say!" PO ativan given. T/C to pt's PMD Dr. Fabian Sharp, case discussed, including:  HPI, pertinent PM/SHx, VS/PE, dx testing, ED course and treatment:  Pt has acted this way in the office also and was told  this behavior would not be tolerated (can remind pt of this and the same goes for the ED), pt does have hx of anxiety and "stress," states she does not know exactly what was told to the pt on the phone and pt was not directly instructed today to go come to the ED, pt should have been told she needs Cards eval for concerning symptoms (DOE associated with diaphoresis) which pt feels are not c/w her usual anxiety, requests if possible to admit pt to [facility] for observation. T/C to [facility] Dr. York Spaniel, case discussed, including:  HPI, pertinent PM/SHx, VS/PE, dx testing, ED course and treatment:  Agreeable to admit, requests to write temporary orders, obtain observation tele bed to team [facility].   Samuel Jester, DO 01/06/15 1400

## 2015-01-03 NOTE — H&P (Signed)
[facility] History and Physical  SEHAR SEDANO JXB:[id number] DOB: [birth year] DOA: 01/03/2015  Referring physician:  PCP: Lorretta Harp, MD  Specialists:   Chief Complaint: chest pains   HPI: Adriana Figueroa is a 64 y.o. female with PMH of HPL, GERD, Peripheral neuropathy (walks with cane/walkeer) presented to ED to chest pains, DOE; per patient "someone called me to go to ED because of blockage found on my CT scan". patient was seen by PCP (1/18) for evaluation of DOE, ordered CT which was neg for PE; patient reports intermittent substernal chest pains, associated with palpitations dizziness, and DOE; denies cough, no fever, nausea, vomiting or diarrhea;  -ED: patient is anxious, hyperventilating which improved after benzo   Review of Systems: The patient denies anorexia, fever, weight loss,, vision loss, decreased hearing, hoarseness, chest pain, syncope, dyspnea on exertion, peripheral edema, balance deficits, hemoptysis, abdominal pain, melena, hematochezia, severe indigestion/heartburn, hematuria, incontinence, genital sores, muscle weakness, suspicious skin lesions, transient blindness, difficulty walking, depression, unusual weight change, abnormal bleeding, enlarged lymph nodes, angioedema, and breast masses.    Past Medical History  Diagnosis Date  . GERD (gastroesophageal reflux disease)     on nexium since 1989 ? had esoph ulceration reported egd not in record  . History of esophageal ulcer   . Hyperlipidemia   . Osteoporosis     by dexa in the past failed orals has GERD last dexa 4/11 -1.4, -1.5 relast Jan 2010  . Fracture     hx in the past  . 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 Primiparous   . DJD (degenerative joint disease)     MRI spine DJD 2011  ack surgery consult.  . Depression     [facility]  . Sleep disorder     decreased  . B12 deficiency     gives her own shots  . Fibromyalgia     ? who made this dx. ? rheum?  Marland Kitchen  Arthritis   . Dislocated shoulder     2008, Constant shoulder pain   . Hypothyroidism   . Chronic back pain   . Peripheral neuropathy     walks with cane, walker  . Hypertension    Past Surgical History  Procedure Laterality Date  . Dislocated shoulder surgery 2008     Social History:  reports that she has never smoked. She does not have any smokeless tobacco history on file. She reports that she drinks alcohol. She reports that she does not use illicit drugs. Home;  where does patient live--home, ALF, SNF? and with whom if at home? Yes;  Can patient participate in ADLs?  Allergies  Allergen Reactions  . Codeine Nausea And Vomiting  . Penicillins Other (See Comments)    unknown  . Vicodin [Hydrocodone-Acetaminophen] Nausea And Vomiting    GI upset    Family History  Problem Relation Age of Onset  . Hypertension Mother   . Osteoporosis Mother   . Kidney failure Mother   . Aneurysm Father     Brain    (be sure to complete)  Prior to Admission medications   Medication Sig Start Date End Date Taking? Authorizing Provider  Calcium Citrate-Vitamin D (CITRACAL + D PO) Take 1 tablet by mouth daily.    Historical Provider, MD  cyanocobalamin (,VITAMIN B-12,) 1000 MCG/ML injection Inject 1 mL (1,000 mcg total) into the muscle once. 06/08/14   Madelin Headings, MD  FOLIC ACID PO Take 1 tablet  by mouth daily.    Historical Provider, MD  gabapentin (NEURONTIN) 800 MG tablet Take 1 tablet (800 mg total) by mouth 4 (four) times daily. 08/18/14   Delia Heady, MD  MAGNESIUM PO Take 1 tablet by mouth daily.    Historical Provider, MD  Multiple Vitamin (MULTIVITAMIN WITH MINERALS) TABS tablet Take 1 tablet by mouth daily.    Historical Provider, MD  Omega-3 Fatty Acids (FISH OIL PO) Take 1 capsule by mouth daily.    Historical Provider, MD  pantoprazole (PROTONIX) 40 MG tablet TAKE ONE TABLET BY MOUTH ONCE DAILY 12/11/14   Madelin Headings, MD  triamterene-hydrochlorothiazide (MAXZIDE-25) 37.5-25  MG per tablet Take 0.5 tablets by mouth daily. For  5 days  Or as directed 06/07/14   Madelin Headings, MD   Physical Exam: Filed Vitals:   01/03/15 1630  BP: 119/71  Pulse: 75  Temp:   Resp:      General:  Alert, anxious   Eyes: EOM-I  ENT: no oral ulcers   Neck: supple, no jvd   Cardiovascular: s1,s2 rrr  Respiratory: CTA BL  Abdomen: soft, nt,nd   Skin: no rash   Musculoskeletal: mild R ankle edema  Psychiatric: no hallucinations   Neurologic: CN 2-12 intact; motor 5/5 BL symmetric   Labs on Admission:  Basic Metabolic Panel:  Recent Labs Lab 01/01/15 1038 01/03/15 1515  NA 135 138  K 4.8 3.7  CL 99 105  CO2 26 22  GLUCOSE 108* 99  BUN 14 7  CREATININE 0.53 0.65  CALCIUM 9.2 10.1   Liver Function Tests:  Recent Labs Lab 01/01/15 1038  AST 54*  ALT 41*  ALKPHOS 45  BILITOT 0.7  PROT 6.5  ALBUMIN 3.9   No results for input(s): LIPASE, AMYLASE in the last 168 hours. No results for input(s): AMMONIA in the last 168 hours. CBC:  Recent Labs Lab 01/01/15 1038 01/03/15 1515  WBC 5.4 4.9  NEUTROABS 3.1  --   HGB 13.9 14.6  HCT 42.0 42.0  MCV 98.1 94.4  PLT 195.0 245   Cardiac Enzymes: No results for input(s): CKTOTAL, CKMB, CKMBINDEX, TROPONINI in the last 168 hours.  BNP (last 3 results)  Recent Labs  01/01/15 1038  PROBNP 66.0   CBG: No results for input(s): GLUCAP in the last 168 hours.  Radiological Exams on Admission: Dg Neck Soft Tissue  01/03/2015   CLINICAL DATA:  Dysphasia for 1 week.  EXAM: NECK SOFT TISSUES - 1+ VIEW  COMPARISON:  None.  FINDINGS: The epiglottis and aryepiglottic folds are normal. No abnormal prevertebral or retropharyngeal soft tissue swelling or abnormal air collections. Prominent lingual tonsils. Prominent anterior spurring from the cervical spine with large bridging osteophytes. The lung apices are clear.  IMPRESSION: Normal epiglottis.  Prominent lingual tonsils.  Large bridging anterior osteophytes  from the cervical spine could potentially impinge on the esophagus.   Electronically Signed   By: Loralie Champagne M.D.   On: 01/03/2015 16:59   Dg Chest 2 View  01/03/2015   CLINICAL DATA:  Dysphagia for 1 week, pain with inspiration  EXAM: CHEST  2 VIEW  COMPARISON:  CT chest 01/02/2015  FINDINGS: Cardiomediastinal silhouette is stable. No acute infiltrate or pleural effusion. No pulmonary edema. Minimal degenerative changes thoracic spine.  IMPRESSION: No active cardiopulmonary disease.   Electronically Signed   By: Natasha Mead M.D.   On: 01/03/2015 16:57   Ct Angio Chest Pe W/cm &/or Wo Cm  01/02/2015  CLINICAL DATA:  Shortness of breath for 6 days  EXAM: CT ANGIOGRAPHY CHEST WITH CONTRAST  TECHNIQUE: Multidetector CT imaging of the chest was performed using the standard protocol during bolus administration of intravenous contrast. Multiplanar CT image reconstructions and MIPs were obtained to evaluate the vascular anatomy.  CONTRAST:  80 mL Omnipaque 350.  COMPARISON:  None.  FINDINGS: The lungs are well aerated bilaterally without focal infiltrate or sizable effusion. No parenchymal nodules are seen.  The thoracic inlet is within normal limits. Mild calcification of the thoracic aorta is seen without aneurysmal dilatation or dissection.  Pulmonary artery is well visualized and demonstrates a normal branching pattern bilaterally. No filling defects to suggest pulmonary emboli are noted. No hilar or mediastinal adenopathy is seen. Coronary calcifications are noted. The cardiac structures are otherwise within normal limits. The upper abdomen reveals no acute abnormality. No acute bony abnormality is noted. Opacification of multiple chest wall venous collaterals are noted likely related to the pressure of the injection.  Review of the MIP images confirms the above findings.  IMPRESSION: No evidence of pulmonary emboli.  No acute abnormality is seen.  These results will be called to the ordering clinician or  representative by the Radiologist Assistant, and communication documented in the PACS or zVision Dashboard.   Electronically Signed   By: Alcide CleverMark  Lukens M.D.   On: 01/02/2015 15:26    EKG: Independently reviewed.   Assessment/Plan Principal Problem:   Chest pain Active Problems:   DOE (dyspnea on exertion)  64 y.o. female with PMH of HTN, HPL, GERD, Peripheral neuropathy (walks with cane/walkeer) presented to ED to chest pains, DOE  1. Chest pains, DOE of unclear etiology; admission troponin: neg; ECG no significant change; CXR: unremarkable;  -monitor ECG, trop; echo; obtain cardiology consultation for stress test eval; cont ASA, NTG prn   2. Anxiety, hyperventilation; CTA chest (1/18): no PE; no respiratory distress on exam;  -cont supportive care,  Ativan as needed  3. L ankle swelling, obtain LE US to r/o DVT; obtain x ray ankle  4. HTN, resume home regimen; monitor  5. Peripheral neuropathy, cont gabapentin    Cardiology;  if consultant consulted, please document name and whether formally or informally consulted  Code Status: full (must indicate code status--if unknown or must be presumed, indicate so) Family Communication: d/w patient (indicate person spoken with, if applicable, with phone number if by telephone) Disposition Plan: home 24-48 hrs  (indicate anticipated LOS)  Time spent: >35 minutes   Esperanza SheetsBURIEV, Adriana Gilberto N [facility] Pager [phone]  If 7PM-7AM, please contact night-coverage [email or web address] Password [email or web address] 01/03/2015, 5:08 PM

## 2015-01-04 ENCOUNTER — Observation Stay (HOSPITAL_COMMUNITY): Payer: 59

## 2015-01-04 ENCOUNTER — Ambulatory Visit: Payer: Self-pay | Admitting: Neurology

## 2015-01-04 DIAGNOSIS — R079 Chest pain, unspecified: Secondary | ICD-10-CM

## 2015-01-04 DIAGNOSIS — F419 Anxiety disorder, unspecified: Secondary | ICD-10-CM | POA: Diagnosis not present

## 2015-01-04 DIAGNOSIS — R1310 Dysphagia, unspecified: Secondary | ICD-10-CM | POA: Diagnosis not present

## 2015-01-04 DIAGNOSIS — K219 Gastro-esophageal reflux disease without esophagitis: Secondary | ICD-10-CM | POA: Diagnosis not present

## 2015-01-04 DIAGNOSIS — R064 Hyperventilation: Secondary | ICD-10-CM

## 2015-01-04 DIAGNOSIS — M7989 Other specified soft tissue disorders: Secondary | ICD-10-CM

## 2015-01-04 DIAGNOSIS — R0600 Dyspnea, unspecified: Secondary | ICD-10-CM

## 2015-01-04 LAB — TROPONIN I

## 2015-01-04 MED ORDER — TECHNETIUM TC 99M SESTAMIBI GENERIC - CARDIOLITE
10.0000 | Freq: Once | INTRAVENOUS | Status: AC | PRN
  Administered 2015-01-04: 10 via INTRAVENOUS

## 2015-01-04 MED ORDER — REGADENOSON 0.4 MG/5ML IV SOLN
0.4000 mg | Freq: Once | INTRAVENOUS | Status: AC
  Administered 2015-01-04: 0.4 mg via INTRAVENOUS
  Filled 2015-01-04: qty 5

## 2015-01-04 MED ORDER — TECHNETIUM TC 99M SESTAMIBI GENERIC - CARDIOLITE
30.0000 | Freq: Once | INTRAVENOUS | Status: AC | PRN
  Administered 2015-01-04: 30 via INTRAVENOUS

## 2015-01-04 MED ORDER — REGADENOSON 0.4 MG/5ML IV SOLN
INTRAVENOUS | Status: AC
  Administered 2015-01-04: 0.4 mg via INTRAVENOUS
  Filled 2015-01-04: qty 5

## 2015-01-04 MED ORDER — LORAZEPAM 0.5 MG PO TABS: 0.5000 mg | ORAL_TABLET | Freq: Four times a day (QID) | ORAL | Status: DC | PRN

## 2015-01-04 NOTE — Progress Notes (Signed)
Lexiscan myoview completed without complication. [facility]  Signed, Azalee CourseHao Felina Tello PA Pager: [phone]

## 2015-01-04 NOTE — Progress Notes (Signed)
Echocardiogram 2D Echocardiogram has been performed.  Adriana Figueroa 01/04/2015, 11:52 AM

## 2015-01-04 NOTE — Progress Notes (Signed)
Patient: Adriana Figueroa / Admit Date: 01/03/2015 / Date of Encounter: 01/04/2015, 8:42 AM   Subjective: Has had SOB for the last week along with intermittent CP. Also had occasional ankle edema. No CP or SOB at this moment.   Objective: Telemetry: NSR Physical Exam: Blood pressure 124/62, pulse 69, temperature 98.4 F (36.9 C), temperature source Oral, resp. rate 18, height  (1.549 m), weight 112 lb (50.803 kg), SpO2 97 %. General: Well developed, well nourished, in no acute distress. Head: Normocephalic, atraumatic, sclera non-icteric, no xanthomas, nares are without discharge. Neck: Negative for carotid bruits. JVP not elevated. Lungs: Clear bilaterally to auscultation without wheezes, rales, or rhonchi. Breathing is unlabored. Heart: RRR S1 S2 without murmurs, rubs, or gallops.  Abdomen: Soft, non-tender, non-distended with normoactive bowel sounds. No rebound/guarding. Extremities: No clubbing or cyanosis. No edema. Distal pedal pulses are 2+ and equal bilaterally. Neuro: Alert and oriented X 3. Moves all extremities spontaneously. Psych:  Responds to questions appropriately with a normal affect.   Intake/Output Summary (Last 24 hours) at 01/04/15 0842 Last data filed at 01/04/15 0800  Gross per 24 hour  Intake      3 ml  Output      0 ml  Net      3 ml    Inpatient Medications:  . sodium chloride   Intravenous STAT  . aspirin  324 mg Oral Once  . aspirin  325 mg Oral Daily  . enoxaparin (LOVENOX) injection  40 mg Subcutaneous Q24H  . folic acid  1 mg Oral Daily  . gabapentin  800 mg Oral QID  . multivitamin with minerals  1 tablet Oral Daily  . pantoprazole  40 mg Oral Daily  . sodium chloride  3 mL Intravenous Q12H   Infusions:    Labs:  Recent Labs  01/01/15 1038 01/03/15 1515  NA 135 138  K 4.8 3.7  CL 99 105  CO2 26 22  GLUCOSE 108* 99  BUN 14 7  CREATININE 0.53 0.65  CALCIUM 9.2 10.1    Recent Labs  01/01/15 1038  AST 54*  ALT 41*  ALKPHOS  45  BILITOT 0.7  PROT 6.5  ALBUMIN 3.9    Recent Labs  01/01/15 1038 01/03/15 1515  WBC 5.4 4.9  NEUTROABS 3.1  --   HGB 13.9 14.6  HCT 42.0 42.0  MCV 98.1 94.4  PLT 195.0 245   No results for input(s): CKTOTAL, CKMB, TROPONINI in the last 72 hours. Invalid input(s): POCBNP No results for input(s): HGBA1C in the last 72 hours.   Radiology/Studies:  Dg Neck Soft Tissue  01/03/2015   CLINICAL DATA:  Dysphasia for 1 week.  EXAM: NECK SOFT TISSUES - 1+ VIEW  COMPARISON:  None.  FINDINGS: The epiglottis and aryepiglottic folds are normal. No abnormal prevertebral or retropharyngeal soft tissue swelling or abnormal air collections. Prominent lingual tonsils. Prominent anterior spurring from the cervical spine with large bridging osteophytes. The lung apices are clear.  IMPRESSION: Normal epiglottis.  Prominent lingual tonsils.  Large bridging anterior osteophytes from the cervical spine could potentially impinge on the esophagus.   Electronically Signed   By: Loralie Champagne M.D.   On: 01/03/2015 16:59   Dg Chest 2 View  01/03/2015   CLINICAL DATA:  Dysphagia for 1 week, pain with inspiration  EXAM: CHEST  2 VIEW  COMPARISON:  CT chest 01/02/2015  FINDINGS: Cardiomediastinal silhouette is stable. No acute infiltrate or pleural effusion. No pulmonary edema. Minimal degenerative  changes thoracic spine.  IMPRESSION: No active cardiopulmonary disease.   Electronically Signed   By: Natasha MeadLiviu  Pop M.D.   On: 01/03/2015 16:57   Dg Ankle 2 Views Left  01/03/2015   CLINICAL DATA:  Bilateral ankle pain swelling, worse on the left. No specific injury.  EXAM: LEFT ANKLE - 2 VIEW  COMPARISON:  Left foot MRI, 03/15/2010.  FINDINGS: No fracture. Ankle mortise is normally spaced and aligned. Bones are diffusely demineralized. There is a small plantar calcaneal spur. Soft tissues are unremarkable.  IMPRESSION: No fracture. No significant ankle joint abnormality. Small calcaneal spur.   Electronically Signed   By:  Amie Portlandavid  Ormond M.D.   On: 01/03/2015 21:20   Dg Ankle 2 Views Right  01/03/2015   CLINICAL DATA:  Pain and swelling for 1 week  EXAM: RIGHT ANKLE - 2 VIEW  COMPARISON:  March 26, 2014  FINDINGS: Frontal and lateral views were obtained. There is mild generalized soft tissue swelling. There is no appreciable fracture or joint effusion. Ankle mortise appears intact. There is a small spur arising from the inferior calcaneus.  IMPRESSION: Mild generalized soft tissue swelling. No fracture. Mortise intact. Small inferior calcaneal spur.   Electronically Signed   By: Bretta BangWilliam  Woodruff M.D.   On: 01/03/2015 21:30   Ct Angio Chest Pe W/cm &/or Wo Cm  01/02/2015   CLINICAL DATA:  Shortness of breath for 6 days  EXAM: CT ANGIOGRAPHY CHEST WITH CONTRAST  TECHNIQUE: Multidetector CT imaging of the chest was performed using the standard protocol during bolus administration of intravenous contrast. Multiplanar CT image reconstructions and MIPs were obtained to evaluate the vascular anatomy.  CONTRAST:  80 mL Omnipaque 350.  COMPARISON:  None.  FINDINGS: The lungs are well aerated bilaterally without focal infiltrate or sizable effusion. No parenchymal nodules are seen.  The thoracic inlet is within normal limits. Mild calcification of the thoracic aorta is seen without aneurysmal dilatation or dissection.  Pulmonary artery is well visualized and demonstrates a normal branching pattern bilaterally. No filling defects to suggest pulmonary emboli are noted. No hilar or mediastinal adenopathy is seen. Coronary calcifications are noted. The cardiac structures are otherwise within normal limits. The upper abdomen reveals no acute abnormality. No acute bony abnormality is noted. Opacification of multiple chest wall venous collaterals are noted likely related to the pressure of the injection.  Review of the MIP images confirms the above findings.  IMPRESSION: No evidence of pulmonary emboli.  No acute abnormality is seen.  These  results will be called to the ordering clinician or representative by the Radiologist Assistant, and communication documented in the PACS or zVision Dashboard.   Electronically Signed   By: Alcide CleverMark  Lukens M.D.   On: 01/02/2015 15:26     Assessment and Plan  1. Atypical chest pain - coronary calcifications seen on CT 01/02/15 but no PE - neck plain film showed that large bridging anterior osteophytes from the cervical spine could potentially impinge on the esophagus - for nuclear stress test today - 2D echo ordered but if nuc is negative this may be appropriate for consideration of doing as outpatient  2. Hyperventilation - required benzodiazepine in ER to control this - CTA negative, BNP unremarkable, troponin negative  3. Reported h/o HLD - no lipids done as inpatient - can be followed by PCP if nuc is negative  Note originally prepared by Ronie Spiesayna Dunn PA-C I have personally reviewed the note and examined the patient, subjective section filled out by me.  [address] PA-C  Signed, Azalee Course [place] Pager: [phone]

## 2015-01-04 NOTE — Discharge Summary (Signed)
Physician Discharge Summary  Adriana Figueroa:[id number] DOB: May 21, 1951 DOA: 01/03/2015  PCP: Lorretta Harp, MD  Admit date: 01/03/2015 Discharge date: 01/04/2015  Time spent: 35 minutes  Recommendations for Outpatient Follow-up:  1. Outpatient PFTS 2. Outpatient referral to psych re: anxiety.depression 3. TED hose  Discharge Diagnoses:  Principal Problem:   Chest pain Active Problems:   DOE (dyspnea on exertion)   Anxiety   Pain in the chest   Hyperventilating   Discharge Condition: improved  Diet recommendation: cardiac  Filed Weights   01/03/15 1451 01/03/15 1831 01/04/15 0445  Weight: 49.896 kg (110 lb) 51.256 kg (113 lb) 50.803 kg (112 lb)    History of present illness:  Adriana Figueroa is a 64 y.o. female with PMH of HPL, GERD, Peripheral neuropathy (walks with cane/walkeer) presented to ED to chest pains, DOE; per patient "someone called me to go to ED because of blockage found on my CT scan". patient was seen by PCP (1/18) for evaluation of DOE, ordered CT which was neg for PE; patient reports intermittent substernal chest pains, associated with palpitations dizziness, and DOE; denies cough, no fever, nausea, vomiting or diarrhea;  -ED: patient is anxious, hyperventilating which improved after benzo   Hospital Course:  Atypical chest pain - coronary calcifications seen on CT 01/02/15 but no PE - neck plain film showed that large bridging anterior osteophytes from the cervical spine could potentially impinge on the esophagus - nuclear stress test low risk  Hyperventilation - required benzodiazepine in ER to control this - CTA negative, BNP unremarkable, troponin negative  Reported h/o HLD - no lipids done as inpatient - can be followed by PCP  Procedures:  Echo  Nuclear stress test  Consultations: Cards  Discharge Exam: Filed Vitals:   01/04/15 1413  BP: 110/55  Pulse: 79  Temp: 97.7 F (36.5 C)  Resp: 20     Discharge  Instructions    Current Discharge Medication List    START taking these medications   Details  LORazepam (ATIVAN) 0.5 MG tablet Take 1 tablet (0.5 mg total) by mouth every 6 (six) hours as needed for anxiety. Qty: 15 tablet, Refills: 0      CONTINUE these medications which have NOT CHANGED   Details  Calcium Citrate-Vitamin D (CITRACAL + D PO) Take 1 tablet by mouth daily.    celecoxib (CELEBREX) 200 MG capsule Take 200 mg by mouth 2 (two) times daily.    FOLIC ACID PO Take 1 tablet by mouth daily.    gabapentin (NEURONTIN) 800 MG tablet Take 1 tablet (800 mg total) by mouth 4 (four) times daily. Qty: 90 tablet, Refills: 3   Associated Diagnoses: Abnormality of gait; B12 deficiency; Unspecified hereditary and idiopathic peripheral neuropathy    MAGNESIUM PO Take 1 tablet by mouth daily.    Multiple Vitamin (MULTIVITAMIN WITH MINERALS) TABS tablet Take 1 tablet by mouth daily.    Omega-3 Fatty Acids (FISH OIL PO) Take 1 capsule by mouth daily.    pantoprazole (PROTONIX) 40 MG tablet TAKE ONE TABLET BY MOUTH ONCE DAILY Qty: 30 tablet, Refills: 5      STOP taking these medications     cyanocobalamin (,VITAMIN B-12,) 1000 MCG/ML injection        Allergies  Allergen Reactions  . Codeine Nausea And Vomiting  . Penicillins Other (See Comments)    unknown  . Vicodin [Hydrocodone-Acetaminophen] Nausea And Vomiting    GI upset      The results of significant diagnostics  from this hospitalization (including imaging, microbiology, ancillary and laboratory) are listed below for reference.    Significant Diagnostic Studies: Dg Neck Soft Tissue  01/03/2015   CLINICAL DATA:  Dysphasia for 1 week.  EXAM: NECK SOFT TISSUES - 1+ VIEW  COMPARISON:  None.  FINDINGS: The epiglottis and aryepiglottic folds are normal. No abnormal prevertebral or retropharyngeal soft tissue swelling or abnormal air collections. Prominent lingual tonsils. Prominent anterior spurring from the cervical  spine with large bridging osteophytes. The lung apices are clear.  IMPRESSION: Normal epiglottis.  Prominent lingual tonsils.  Large bridging anterior osteophytes from the cervical spine could potentially impinge on the esophagus.   Electronically Signed   By: Loralie Champagne M.D.   On: 01/03/2015 16:59   Dg Chest 2 View  01/03/2015   CLINICAL DATA:  Dysphagia for 1 week, pain with inspiration  EXAM: CHEST  2 VIEW  COMPARISON:  CT chest 01/02/2015  FINDINGS: Cardiomediastinal silhouette is stable. No acute infiltrate or pleural effusion. No pulmonary edema. Minimal degenerative changes thoracic spine.  IMPRESSION: No active cardiopulmonary disease.   Electronically Signed   By: Natasha Mead M.D.   On: 01/03/2015 16:57   Dg Ankle 2 Views Left  01/03/2015   CLINICAL DATA:  Bilateral ankle pain swelling, worse on the left. No specific injury.  EXAM: LEFT ANKLE - 2 VIEW  COMPARISON:  Left foot MRI, 03/15/2010.  FINDINGS: No fracture. Ankle mortise is normally spaced and aligned. Bones are diffusely demineralized. There is a small plantar calcaneal spur. Soft tissues are unremarkable.  IMPRESSION: No fracture. No significant ankle joint abnormality. Small calcaneal spur.   Electronically Signed   By: Amie Portland M.D.   On: 01/03/2015 21:20   Dg Ankle 2 Views Right  01/03/2015   CLINICAL DATA:  Pain and swelling for 1 week  EXAM: RIGHT ANKLE - 2 VIEW  COMPARISON:  March 26, 2014  FINDINGS: Frontal and lateral views were obtained. There is mild generalized soft tissue swelling. There is no appreciable fracture or joint effusion. Ankle mortise appears intact. There is a small spur arising from the inferior calcaneus.  IMPRESSION: Mild generalized soft tissue swelling. No fracture. Mortise intact. Small inferior calcaneal spur.   Electronically Signed   By: Bretta Bang M.D.   On: 01/03/2015 21:30   Ct Angio Chest Pe W/cm &/or Wo Cm  01/02/2015   CLINICAL DATA:  Shortness of breath for 6 days  EXAM: CT  ANGIOGRAPHY CHEST WITH CONTRAST  TECHNIQUE: Multidetector CT imaging of the chest was performed using the standard protocol during bolus administration of intravenous contrast. Multiplanar CT image reconstructions and MIPs were obtained to evaluate the vascular anatomy.  CONTRAST:  80 mL Omnipaque 350.  COMPARISON:  None.  FINDINGS: The lungs are well aerated bilaterally without focal infiltrate or sizable effusion. No parenchymal nodules are seen.  The thoracic inlet is within normal limits. Mild calcification of the thoracic aorta is seen without aneurysmal dilatation or dissection.  Pulmonary artery is well visualized and demonstrates a normal branching pattern bilaterally. No filling defects to suggest pulmonary emboli are noted. No hilar or mediastinal adenopathy is seen. Coronary calcifications are noted. The cardiac structures are otherwise within normal limits. The upper abdomen reveals no acute abnormality. No acute bony abnormality is noted. Opacification of multiple chest wall venous collaterals are noted likely related to the pressure of the injection.  Review of the MIP images confirms the above findings.  IMPRESSION: No evidence of pulmonary emboli.  No  acute abnormality is seen.  These results will be called to the ordering clinician or representative by the Radiologist Assistant, and communication documented in the PACS or zVision Dashboard.   Electronically Signed   By: Alcide CleverMark  Lukens M.D.   On: 01/02/2015 15:26    Microbiology: No results found for this or any previous visit (from the past 240 hour(s)).   Labs: Basic Metabolic Panel:  Recent Labs Lab 01/01/15 1038 01/03/15 1515  NA 135 138  K 4.8 3.7  CL 99 105  CO2 26 22  GLUCOSE 108* 99  BUN 14 7  CREATININE 0.53 0.65  CALCIUM 9.2 10.1   Liver Function Tests:  Recent Labs Lab 01/01/15 1038  AST 54*  ALT 41*  ALKPHOS 45  BILITOT 0.7  PROT 6.5  ALBUMIN 3.9   No results for input(s): LIPASE, AMYLASE in the last 168  hours. No results for input(s): AMMONIA in the last 168 hours. CBC:  Recent Labs Lab 01/01/15 1038 01/03/15 1515  WBC 5.4 4.9  NEUTROABS 3.1  --   HGB 13.9 14.6  HCT 42.0 42.0  MCV 98.1 94.4  PLT 195.0 245   Cardiac Enzymes: No results for input(s): CKTOTAL, CKMB, CKMBINDEX, TROPONINI in the last 168 hours. BNP: BNP (last 3 results)  Recent Labs  01/01/15 1038  PROBNP 66.0   CBG: No results for input(s): GLUCAP in the last 168 hours.     SignedMarlin Canary:  Keveon Amsler  [facility] 01/04/2015, 3:01 PM

## 2015-01-04 NOTE — Progress Notes (Signed)
*  Preliminary Results* Left lower extremity venous duplex completed. Left lower extremity is negative for deep vein thrombosis. There is no evidence of left Baker's cyst.  01/04/2015 10:27 AM  Gertie FeyMichelle Tonjia Parillo, RVT, RDCS, RDMS

## 2015-01-08 ENCOUNTER — Telehealth: Payer: Self-pay | Admitting: Internal Medicine

## 2015-01-08 NOTE — Telephone Encounter (Signed)
Noted  

## 2015-01-08 NOTE — Telephone Encounter (Signed)
Will not need a B12 tomorrow. She had one in the hospital last week.

## 2015-01-09 ENCOUNTER — Other Ambulatory Visit: Payer: Self-pay

## 2015-01-09 ENCOUNTER — Other Ambulatory Visit: Payer: Self-pay | Admitting: Family Medicine

## 2015-01-09 ENCOUNTER — Ambulatory Visit (INDEPENDENT_AMBULATORY_CARE_PROVIDER_SITE_OTHER): Payer: 59 | Admitting: Family Medicine

## 2015-01-09 DIAGNOSIS — M810 Age-related osteoporosis without current pathological fracture: Secondary | ICD-10-CM

## 2015-01-09 MED ORDER — DENOSUMAB 60 MG/ML ~~LOC~~ SOLN
60.0000 mg | Freq: Once | SUBCUTANEOUS | Status: AC
  Administered 2015-01-09: 60 mg via SUBCUTANEOUS

## 2015-01-09 NOTE — Telephone Encounter (Signed)
Per WP, pt may have #30.  Needs to be seen next week for post hospital follow up.

## 2015-01-09 NOTE — Telephone Encounter (Signed)
Patient is calling for a RX for Tramadol patient tries not to take RX. Patient is having pain.

## 2015-01-10 ENCOUNTER — Telehealth: Payer: Self-pay | Admitting: Family Medicine

## 2015-01-10 MED ORDER — TRAMADOL HCL 50 MG PO TABS: 50.0000 mg | ORAL_TABLET | Freq: Four times a day (QID) | ORAL | Status: DC | PRN

## 2015-01-10 MED ORDER — LORAZEPAM 0.5 MG PO TABS: 0.5000 mg | ORAL_TABLET | Freq: Four times a day (QID) | ORAL | Status: DC | PRN

## 2015-01-10 NOTE — Telephone Encounter (Signed)
Pt already has an appt for 01-17-15

## 2015-01-10 NOTE — Telephone Encounter (Signed)
Called to the pharmacy and left on machine.  Sent a message to scheduling to help the pt get at appt.

## 2015-01-10 NOTE — Telephone Encounter (Signed)
[facility] needs to be seen for post hospital follow up next week.  Please help the pt to get on the schedule.  Thanks!

## 2015-01-10 NOTE — Telephone Encounter (Signed)
Ok to do so

## 2015-01-10 NOTE — Telephone Encounter (Signed)
Rx has been sent for same dose previously prescribed.  I called patient, left messge.

## 2015-01-15 ENCOUNTER — Telehealth: Payer: Self-pay | Admitting: Internal Medicine

## 2015-01-15 NOTE — Telephone Encounter (Signed)
Pt notified.  Will see her in the office on 01/17/15 @ 10AM.

## 2015-01-15 NOTE — Telephone Encounter (Signed)
Pt was lifting weight  and pulled muscle in  upper arm. Pt would like flexeril call into [facility]

## 2015-01-15 NOTE — Telephone Encounter (Signed)
Per WP, does not think Flexeril will help.  Will evaluate further when she comes in on Wednesday.  Use an ice pack on the area.

## 2015-01-17 ENCOUNTER — Ambulatory Visit: Payer: Self-pay | Admitting: Internal Medicine

## 2015-01-17 ENCOUNTER — Encounter: Payer: Self-pay | Admitting: Internal Medicine

## 2015-01-17 ENCOUNTER — Ambulatory Visit: Payer: Self-pay | Admitting: Family Medicine

## 2015-01-17 NOTE — Progress Notes (Signed)
Document opened and reviewed for OV but appt  canceled same day . weather

## 2015-01-23 ENCOUNTER — Telehealth: Payer: Self-pay | Admitting: Internal Medicine

## 2015-01-23 ENCOUNTER — Other Ambulatory Visit: Payer: Self-pay | Admitting: Internal Medicine

## 2015-01-23 ENCOUNTER — Other Ambulatory Visit: Payer: Self-pay | Admitting: Neurology

## 2015-01-23 NOTE — Telephone Encounter (Signed)
Called to the pharmacy and left on machine. 

## 2015-01-23 NOTE — Telephone Encounter (Deleted)
Called patient to get her rescheduled to see Dr Turner, she informed not to call her she had a lot going on in her life and she would call back when she was ready.. Sent telephone message to Deborah Dawkins so she can share this information with referring doctor ( Dr Panosh)/[place]Clark ° °

## 2015-01-23 NOTE — Telephone Encounter (Signed)
Has appt this Friday  Disp 15    Take if needed tid  Until we see her.

## 2015-01-23 NOTE — Telephone Encounter (Signed)
Called patient to get her rescheduled to see Dr Mayford Knifeurner, she informed not to call her she had a lot going on in her life and she would call back when she was ready.Adriana Figueroa. Sent telephone message to Adriana ProctorDeborah Figueroa so she can share this information with referring doctor ( Dr Adriana Figueroa)/[place]

## 2015-01-25 ENCOUNTER — Telehealth: Payer: Self-pay

## 2015-01-25 MED ORDER — TRAMADOL HCL 50 MG PO TABS: 50.0000 mg | ORAL_TABLET | Freq: Four times a day (QID) | ORAL | Status: DC | PRN

## 2015-01-25 NOTE — Telephone Encounter (Signed)
Tramadol 50mg  #30 was auth on 01/27.  Okay to fill again at this time?

## 2015-01-25 NOTE — Telephone Encounter (Signed)
Patient calling for a refill for Tramadol 50 mg.

## 2015-01-25 NOTE — Telephone Encounter (Signed)
Yes

## 2015-01-26 ENCOUNTER — Encounter: Payer: Self-pay | Admitting: Internal Medicine

## 2015-01-26 ENCOUNTER — Ambulatory Visit: Payer: Self-pay | Admitting: Cardiology

## 2015-01-26 ENCOUNTER — Ambulatory Visit (INDEPENDENT_AMBULATORY_CARE_PROVIDER_SITE_OTHER): Payer: 59 | Admitting: Internal Medicine

## 2015-01-26 VITALS — BP 124/70 | HR 75 | Temp 98.2°F | Ht 61.0 in

## 2015-01-26 DIAGNOSIS — F4323 Adjustment disorder with mixed anxiety and depressed mood: Secondary | ICD-10-CM

## 2015-01-26 DIAGNOSIS — R7989 Other specified abnormal findings of blood chemistry: Secondary | ICD-10-CM

## 2015-01-26 DIAGNOSIS — D519 Vitamin B12 deficiency anemia, unspecified: Secondary | ICD-10-CM

## 2015-01-26 DIAGNOSIS — D518 Other vitamin B12 deficiency anemias: Secondary | ICD-10-CM

## 2015-01-26 DIAGNOSIS — R0602 Shortness of breath: Secondary | ICD-10-CM

## 2015-01-26 DIAGNOSIS — Z09 Encounter for follow-up examination after completed treatment for conditions other than malignant neoplasm: Secondary | ICD-10-CM

## 2015-01-26 DIAGNOSIS — R0789 Other chest pain: Secondary | ICD-10-CM

## 2015-01-26 DIAGNOSIS — R945 Abnormal results of liver function studies: Secondary | ICD-10-CM

## 2015-01-26 DIAGNOSIS — E785 Hyperlipidemia, unspecified: Secondary | ICD-10-CM

## 2015-01-26 DIAGNOSIS — G629 Polyneuropathy, unspecified: Secondary | ICD-10-CM

## 2015-01-26 MED ORDER — CYANOCOBALAMIN 1000 MCG/ML IJ SOLN
1000.0000 ug | Freq: Once | INTRAMUSCULAR | Status: AC
  Administered 2015-01-26: 1000 ug via INTRAMUSCULAR

## 2015-01-26 MED ORDER — LORAZEPAM 0.5 MG PO TABS: 0.5000 mg | ORAL_TABLET | Freq: Three times a day (TID) | ORAL | Status: DC | PRN

## 2015-01-26 NOTE — Progress Notes (Signed)
Pre visit review using our clinic review tool, if applicable. No additional management support is needed unless otherwise documented below in the visit note.  Chief Complaint  Patient presents with  . Hospitalization Follow-up    Pt complains of all over body pain.  The pain is constant.    HPI: Patient comes in today as follow up from hospitalization for atypical chest pain and shortness of breath with associated symptoms. She underwent evaluation then included echocardiogram nuclear medicine stress test which was a low risk and blood tests that did not show any heart damage. Cardiology consult felt that she did not have active disease even though there was risk from her coronary artery calcium on a CT incidentally. She was discharged on Ativan 3 times a day which she did take and helped her anxiety but hasn't taken it recently.  She hasn't had a recent B12 shot. In the interim she felt like she pulled a muscle in her right shoulder when she was trying to do weights. She saw Dr. Lajoyce Cornersuda orthopedics who felt she had a right tater Crough problem including arthritis. Was given prednisone Dosepak she's had 2 days so far. Still bothers her some  She still has some shortness of breath. Feels that her health is deteriorating hurts all over. She admits to anxiety and stress and deep emotional pain from the breakup of her marriage while back of 35 years feels guilt from leaving her parents and having her marriage failed. In the past she has declined counseling because does not want to be seen as "cuckoo".  She still grieves what happened.  Neg tad  She had [facility] but is going to stop those.   ROS: See pertinent positives and negatives per HPno recent falling Past Medical History  Diagnosis Date  . GERD (gastroesophageal reflux disease)     on nexium since 1989 ? had esoph ulceration reported egd not in record  . History of esophageal ulcer   . Hyperlipidemia   .  Osteoporosis     by dexa in the past failed orals has GERD last dexa 4/11 -1.4, -1.5 relast Jan 2010  . Fracture     hx in the past  . 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 Primiparous   . DJD (degenerative joint disease)     MRI spine DJD 2011  ack surgery consult.  . Depression     [facility]  . Sleep disorder     decreased  . B12 deficiency     gives her own shots  . Fibromyalgia     ? who made this dx. ? rheum?  Marland Kitchen. Arthritis   . Dislocated shoulder     2008, Constant shoulder pain   . Hypothyroidism   . Chronic back pain   . Peripheral neuropathy     walks with cane, walker  . Hypertension     Family History  Problem Relation Age of Onset  . Hypertension Mother   . Osteoporosis Mother   . Kidney failure Mother   . Aneurysm Father     Brain    History   Social History  . Marital Status: Divorced    Spouse Name: N/A  . Number of Children: 0  . Years of Education: BS and MBA   Social History Main Topics  . Smoking status: Never Smoker   . Smokeless tobacco: Not on file  . Alcohol Use: Yes  Comment: OCCASIONAL  . Drug Use: No  . Sexual Activity: Not on file   Other Topics Concern  . 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    Outpatient Encounter Prescriptions as of 01/26/2015  Medication Sig  . Calcium Citrate-Vitamin D (CITRACAL + D PO) Take 1 tablet by mouth daily.  . celecoxib (CELEBREX) 200 MG capsule Take 200 mg by mouth 2 (two) times daily.  Marland Kitchen FOLIC ACID PO Take 1 tablet by mouth daily.  Marland Kitchen gabapentin (NEURONTIN) 800 MG tablet Take 1 tablet (800 mg total) by mouth 4 (four) times daily.  Marland Kitchen LORazepam (ATIVAN) 0.5 MG tablet Take  1 tablet (0.5 mg total) by mouth 3 (three) times daily as needed for anxiety (limit  regular use). for anxiety  . MAGNESIUM PO Take 1 tablet by mouth daily.  . Multiple Vitamin (MULTIVITAMIN WITH MINERALS) TABS tablet Take 1 tablet by mouth daily.  . Omega-3 Fatty Acids (FISH OIL PO) Take 1 capsule by mouth daily.  . pantoprazole (PROTONIX) 40 MG tablet TAKE ONE TABLET BY MOUTH ONCE DAILY  . traMADol (ULTRAM) 50 MG tablet Take 1 tablet (50 mg total) by mouth every 6 (six) hours as needed (pain).  . [DISCONTINUED] LORazepam (ATIVAN) 0.5 MG tablet Take 1 tablet (0.5 mg total) by mouth 3 (three) times daily as needed for anxiety. for anxiety  . [EXPIRED] cyanocobalamin ((VITAMIN B-12)) injection 1,000 mcg     EXAM:  BP 124/70 mmHg  Pulse 75  Temp(Src) 98.2 F (36.8 C) (Temporal)  Ht  (1.549 m)  SpO2 99%  There is no weight on file to calculate BMI.  GENERAL: vitals reviewed and listed above, alert, oriented, appears well hydrated and in no acute distress she becomes emotional when discussing her marriage and shows obvious great pain but has normal thought process is and speech. HEENT: atraumatic, conjunctiva  clear, no obvious abnormalities on inspection of external nose and ears NECK: no obvious masses on inspection palpation  LUNGS: clear to auscultation bilaterally, no wheezes, rales or rhonchi, good air movement CV: HRRR, no clubbing cyanosis or nl cap refill  MS: moves all extremities  she is sitting in the wheelchair because she is more comfortable but does have her cane  PSYCH:  cooperative with normal speech looks tired emotional tearful when speaking of her marriage but also with pain and anger  Lab Results  Component Value Date   WBC 4.9 01/03/2015   HGB 14.6 01/03/2015   HCT 42.0 01/03/2015   PLT 245 01/03/2015   GLUCOSE 99 01/03/2015   CHOL 337* 08/08/2014   TRIG 166.0* 08/08/2014   HDL 131.00 08/08/2014   LDLDIRECT 120.5 07/30/2011   LDLCALC 173* 08/08/2014   ALT  41* 01/01/2015   AST 54* 01/01/2015   NA 138 01/03/2015   K 3.7 01/03/2015   CL 105 01/03/2015   CREATININE 0.65 01/03/2015   BUN 7 01/03/2015   CO2 22 01/03/2015   TSH 3.27 01/01/2015   INR 0.88 09/20/2010   HGBA1C 5.6 04/17/2014    ASSESSMENT AND PLAN:  Discussed the following assessment and plan:  Atypical chest pain -  Plan: Lipid panel, Hepatic function panel, Ferritin, Sedimentation rate, Pulmonary function test  Neuropathy - Plan: Lipid panel, Hepatic function panel, Ferritin, Sedimentation rate  ANEMIA, VITAMIN B12 DEFICIENCY - Plan: Lipid panel, Hepatic function panel, Ferritin, Sedimentation rate, cyanocobalamin ((VITAMIN B-12)) injection 1,000 mcg  Abnormal LFTs - Plan: Lipid panel, Hepatic function panel, Ferritin, Sedimentation rate  Shortness of breath - prob from anxiety but check lung status  - Plan: Lipid panel, Hepatic function panel, Ferritin, Sedimentation rate, Pulmonary function test  Hyperlipidemia - Plan: Lipid panel, Hepatic function panel, Ferritin, Sedimentation rate  Elevated ferritin  Adjustment disorder with mixed anxiety and depressed mood - Plan: Ambulatory referral to Psychology  Hospital discharge follow-up  High ferritin and los ibc  Uncertain significance at this time  At risk with long term   benzos fallingand  Dependence although certainly anxiety is a major issue causing sx .  patient is not taking this regularly she is substance free and is aware will use it only as needed shortness of breath could be a combination of anxiety and pulmonary dysfunction or limitation.  Spent a good deal of time in regard to encourage counseling she is obviously and psychological pain and admits that. But has been reluctant to see a counselor possibly for cultural reasons. I think that she would be helped because she is still grieving angry sad and pain over the loss of her marriage and moving away from her parents from her home country.  pfts order   Will  come back for fasting labs orders and system  Needs to make sure continuing b12  -Patient advised to return or notify health care team  if symptoms worsen ,persist or new concerns arise.  Patient Instructions  Stop the iron . Labs today . Ok to take vit d and of course the b 12  Shots.  I want you to see a counselor to help you get through the   pain and loss you have have been dealing with . This is affecting your health.  I t is good you dont have any significant heart disease causing the chest pain and breathing problem   Will arrange lung tests  . Take the ativan as needed for panic but limit use cause can cause mental fogginess and   Risk of falling and dont take with driving. At this time .  [facility]  [facility] has counselors .         Neta Mends. Johnattan Strassman M.D.  Total visit > 50% spent counseling and coordinating care

## 2015-01-26 NOTE — Patient Instructions (Addendum)
Stop the iron . Labs today . Ok to take vit d and of course the b 12  Shots.  I want you to see a counselor to help you get through the   pain and loss you have have been dealing with . This is affecting your health.  I t is good you dont have any significant heart disease causing the chest pain and breathing problem   Will arrange lung tests  . Take the ativan as needed for panic but limit use cause can cause mental fogginess and   Risk of falling and dont take with driving. At this time .  [facility]  [facility] has counselors .

## 2015-01-30 ENCOUNTER — Other Ambulatory Visit: Payer: Self-pay

## 2015-02-06 ENCOUNTER — Telehealth: Payer: Self-pay | Admitting: Internal Medicine

## 2015-02-06 NOTE — Telephone Encounter (Signed)
Pt has been  rescheduled for 03-01-2015 @4 :00 pm pt will call [facility] to get earlier time spoke with pt

## 2015-02-06 NOTE — Telephone Encounter (Signed)
Pt states she was previously scheduled for PFT but had to cancel due to inclement weather.  Pt requesting to have appt rescheduled.  If she does not answer please leave message on answering machine with appt info.

## 2015-02-08 ENCOUNTER — Other Ambulatory Visit (INDEPENDENT_AMBULATORY_CARE_PROVIDER_SITE_OTHER): Payer: 59

## 2015-02-08 ENCOUNTER — Ambulatory Visit (INDEPENDENT_AMBULATORY_CARE_PROVIDER_SITE_OTHER): Payer: 59 | Admitting: Family Medicine

## 2015-02-08 DIAGNOSIS — R0789 Other chest pain: Secondary | ICD-10-CM

## 2015-02-08 DIAGNOSIS — R0602 Shortness of breath: Secondary | ICD-10-CM

## 2015-02-08 DIAGNOSIS — G629 Polyneuropathy, unspecified: Secondary | ICD-10-CM

## 2015-02-08 DIAGNOSIS — D518 Other vitamin B12 deficiency anemias: Secondary | ICD-10-CM

## 2015-02-08 DIAGNOSIS — R945 Abnormal results of liver function studies: Secondary | ICD-10-CM

## 2015-02-08 DIAGNOSIS — D519 Vitamin B12 deficiency anemia, unspecified: Secondary | ICD-10-CM

## 2015-02-08 DIAGNOSIS — E785 Hyperlipidemia, unspecified: Secondary | ICD-10-CM

## 2015-02-08 DIAGNOSIS — R7989 Other specified abnormal findings of blood chemistry: Secondary | ICD-10-CM

## 2015-02-08 LAB — LIPID PANEL
CHOL/HDL RATIO: 3
CHOLESTEROL: 244 mg/dL — AB (ref 0–200)
HDL: 79.5 mg/dL (ref >39.00)
LDL Cholesterol: 135 mg/dL — ABNORMAL HIGH (ref 0–99)
NonHDL: 164.5
Triglycerides: 147 mg/dL (ref 0.0–149.0)
VLDL: 29.4 mg/dL (ref 0.0–40.0)

## 2015-02-08 LAB — SEDIMENTATION RATE: Sed Rate: 7 mm/hr (ref 0–22)

## 2015-02-08 LAB — HEPATIC FUNCTION PANEL
ALK PHOS: 70 U/L (ref 39–117)
ALT: 11 U/L (ref 0–35)
AST: 15 U/L (ref 0–37)
Albumin: 4 g/dL (ref 3.5–5.2)
Bilirubin, Direct: 0.2 mg/dL (ref 0.0–0.3)
Total Bilirubin: 0.8 mg/dL (ref 0.2–1.2)
Total Protein: 6.8 g/dL (ref 6.0–8.3)

## 2015-02-08 LAB — FERRITIN: Ferritin: 121.8 ng/mL (ref 10.0–291.0)

## 2015-02-08 MED ORDER — CYANOCOBALAMIN 1000 MCG/ML IJ SOLN
1000.0000 ug | Freq: Once | INTRAMUSCULAR | Status: AC
  Administered 2015-02-08: 1000 ug via INTRAMUSCULAR

## 2015-02-09 ENCOUNTER — Ambulatory Visit: Payer: Self-pay | Admitting: Family Medicine

## 2015-02-16 ENCOUNTER — Ambulatory Visit: Payer: 59 | Admitting: Nurse Practitioner

## 2015-02-23 ENCOUNTER — Ambulatory Visit (INDEPENDENT_AMBULATORY_CARE_PROVIDER_SITE_OTHER): Payer: 59 | Admitting: Family Medicine

## 2015-02-23 DIAGNOSIS — D519 Vitamin B12 deficiency anemia, unspecified: Secondary | ICD-10-CM

## 2015-02-23 MED ORDER — CYANOCOBALAMIN 1000 MCG/ML IJ SOLN
1000.0000 ug | Freq: Once | INTRAMUSCULAR | Status: AC
  Administered 2015-02-23: 1000 ug via INTRAMUSCULAR

## 2015-02-26 ENCOUNTER — Encounter: Payer: Self-pay | Admitting: Adult Health

## 2015-02-26 ENCOUNTER — Ambulatory Visit (INDEPENDENT_AMBULATORY_CARE_PROVIDER_SITE_OTHER): Payer: 59 | Admitting: Adult Health

## 2015-02-26 VITALS — BP 127/82 | HR 75 | Ht 65.0 in | Wt 114.0 lb

## 2015-02-26 DIAGNOSIS — E538 Deficiency of other specified B group vitamins: Secondary | ICD-10-CM | POA: Diagnosis not present

## 2015-02-26 DIAGNOSIS — G609 Hereditary and idiopathic neuropathy, unspecified: Secondary | ICD-10-CM

## 2015-02-26 DIAGNOSIS — R269 Unspecified abnormalities of gait and mobility: Secondary | ICD-10-CM | POA: Diagnosis not present

## 2015-02-26 MED ORDER — GABAPENTIN 800 MG PO TABS: 800.0000 mg | ORAL_TABLET | Freq: Four times a day (QID) | ORAL | Status: DC

## 2015-02-26 NOTE — Patient Instructions (Signed)
Continue taking gabapentin 800 mg 4 times a day. Continue B12 supplements. Continue using her cane or walker to prevent falls. If your symptoms worsen or he develops new symptoms please let us know.

## 2015-02-26 NOTE — Progress Notes (Signed)
PATIENT: Adriana Figueroa DOB: 05-Jun-1951  REASON FOR VISIT: follow up- peripheral neuropathy, B12 deficiency, abnormality of gait HISTORY FROM: patient  HISTORY OF PRESENT ILLNESS: Adriana Figueroa is a 64 year old female with a history of peripheral neuropathy, B12 deficiency and abnormality of gait. She returns today for follow-up. She is currently taking gabapentin 800 mg 4 times a day. She reports that this is working well. She has burning and tingling in the hands and feet but denies numbness. She will use tramadol for severe pain at night if needed. She continues to take B12 supplements daily. She uses a cane to ambulate but will use a walker for longer distances. She denies any falls. Patient states that she tries to do exercises daily. She denies any new neurological symptoms. Denies any new medical issues.  HISTORY 08/18/14 (SETHI): 50 year Asian [place] origin lady is a poor historian. She is provided slightly different history in the past when admitted to the hospital in November 2014 seen by Dr Roseanne Reno as well as when seen by neurologist Dr. Tobie Poet in February 2015 chiefly with difficulty walking since last fall and was hospitalized to [facility] and found to have low potassium of 2.8, UTI . She was recently found to have a low vitamin B12 level of 169 on 12/19/2013.Marland Kitchen She was not compliant with the therapy for B 12 supplementation with Dr. Fabian Sharp.. She did not want injections and has been taking B12 tablets. She was referred to see Dr. Allena Katz neurologist at [facility] neurology on 01/24/14 but did not have a good visit. The patient was described as being quite agitated, uncooperative and refused to complete exam and evaluation and treatment recommendations hence were incomplete. She described to Dr. Allena Katz that she cannot feel anything anywhere but today she tells me that she has increased sensitivity in both hands as well as in feet from ankle down. She was wheelchair-bound last fall but  now states she is able to walk with a wheeled walker since December. She can still fall easily and has to walk slowly. She is a vegetarian in her diet and does not eat any good sources of vitamin B12. She has had underlying psychiatric problems and apparently has not been compliant with her medications or followup with the psychiatrist either. She is still quite tearful about her divorce after 35 years of marriage and not having any social support. She had MRI scan of the lumbar spine showing broad-based foraminal and extraforaminal disc protrusions on the left at L3-4 and L4-5 with only mild foraminal narrowing which were actually unchanged compared to prior study in 2011. The patient's symptoms apparently had started in 2011 without warning and her husband suddenly left the 34 years of marriage hence conversion disorder and depressed mood were felt to be responsible for her symptoms. She denies any memory loss, cognitive difficulties, but bladder bowel incontinence Update 08/18/2014 : She returns for followup of her last visit 4 months ago. She has noticed improvement in her gait and balance after taking the B12 injections regularly. Her last follow vitamins B12 level was on 04/17/14 and was normal at 719. She however continues to have tingling and numbness in her hands which is bothersome. She is taking gabapentin 800 mg three times daily and tolerating it quite well. She is now using a cane to walk most of the time but uses a walker only for long distances. She has not had any falls. She did not undergo MRI of the neck which  I had ordered but did undergo lab work. Nerve conduction study done on 05/09/14 by Dr. Anne Hahn is suggestive of a sensory neuropathy. Bloodwork on 04/17/14 vitamin B12 of 719, methylmalonic acid is normal at 120 TSH is normal at 0.76 and hemoglobin A1c at 5.6.  REVIEW OF SYSTEMS: Out of a complete 14 system review of symptoms, the patient complains only of the following symptoms, and all other  reviewed systems are negative.  See history of present illness  ALLERGIES: Allergies  Allergen Reactions  . Codeine Nausea And Vomiting  . Penicillins Other (See Comments)    unknown  . Vicodin [Hydrocodone-Acetaminophen] Nausea And Vomiting    GI upset    HOME MEDICATIONS: Outpatient Prescriptions Prior to Visit  Medication Sig Dispense Refill  . Calcium Citrate-Vitamin D (CITRACAL + D PO) Take 1 tablet by mouth daily.    . celecoxib (CELEBREX) 200 MG capsule Take 200 mg by mouth 2 (two) times daily.    Marland Kitchen FOLIC ACID PO Take 1 tablet by mouth daily.    Marland Kitchen gabapentin (NEURONTIN) 800 MG tablet Take 1 tablet (800 mg total) by mouth 4 (four) times daily. 90 tablet 3  . LORazepam (ATIVAN) 0.5 MG tablet Take 1 tablet (0.5 mg total) by mouth 3 (three) times daily as needed for anxiety (limit  regular use). for anxiety 24 tablet 0  . MAGNESIUM PO Take 1 tablet by mouth daily.    . Multiple Vitamin (MULTIVITAMIN WITH MINERALS) TABS tablet Take 1 tablet by mouth daily.    . Omega-3 Fatty Acids (FISH OIL PO) Take 1 capsule by mouth daily.    . pantoprazole (PROTONIX) 40 MG tablet TAKE ONE TABLET BY MOUTH ONCE DAILY 30 tablet 5  . traMADol (ULTRAM) 50 MG tablet Take 1 tablet (50 mg total) by mouth every 6 (six) hours as needed (pain). 30 tablet 1   No facility-administered medications prior to visit.    PAST MEDICAL HISTORY: Past Medical History  Diagnosis Date  . GERD (gastroesophageal reflux disease)     on nexium since 1989 ? had esoph ulceration reported egd not in record  . History of esophageal ulcer   . Hyperlipidemia   . Osteoporosis     by dexa in the past failed orals has GERD last dexa 4/11 -1.4, -1.5 relast Jan 2010  . Fracture     hx in the past  . 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 Primiparous   . DJD (degenerative joint disease)     MRI spine DJD 2011  ack surgery consult.  . Depression     [facility]  . Sleep disorder     decreased  .  B12 deficiency     gives her own shots  . Fibromyalgia     ? who made this dx. ? rheum?  Marland Kitchen Arthritis   . Dislocated shoulder     2008, Constant shoulder pain   . Hypothyroidism   . Chronic back pain   . Peripheral neuropathy     walks with cane, walker  . Hypertension     PAST SURGICAL HISTORY: Past Surgical History  Procedure Laterality Date  . Dislocated shoulder surgery 2008      FAMILY HISTORY: Family History  Problem Relation Age of Onset  . Hypertension Mother   . Osteoporosis Mother   . Kidney failure Mother   . Aneurysm Father     Brain    SOCIAL HISTORY: History   Social History  .  Marital Status: Divorced    Spouse Name: N/A  . Number of Children: 0  . Years of Education: BS and MBA   Occupational History  . Not on file.   Social History Main Topics  . Smoking status: Never Smoker   . Smokeless tobacco: Not on file  . Alcohol Use: Yes     Comment: OCCASIONAL  . Drug Use: No  . Sexual Activity: Not on file   Other Topics Concern  . 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      PHYSICAL EXAM  Filed Vitals:   02/26/15 1340  BP: 127/82  Pulse: 75  Height:  (1.651 m)  Weight: 114 lb (51.71 kg)   Body mass index is 18.97 kg/(m^2).  Generalized: Well developed, in no acute distress   Neurological examination  Mentation: Alert oriented to time, place, history taking. Follows all commands speech and language fluent Cranial nerve II-XII: Pupils were equal round reactive to light. Extraocular movements were full, visual field were full on confrontational test. Facial sensation and strength were normal. Uvula tongue midline. Head turning  and shoulder shrug  were normal and symmetric. Motor: The motor testing reveals 5 over 5 strength of all 4 extremities. Good symmetric motor tone is noted throughout.  Sensory: Sensory testing is intact to soft touch on all 4 extremities. No evidence of extinction is noted.  Coordination: Cerebellar testing reveals good finger-nose-finger and heel-to-shin bilaterally.  Gait and station: Gait is unsteady without walker. Tandem gait not attempted. Romberg is unsteady but negative.  Reflexes: Deep tendon reflexes are symmetric and normal bilaterally.    DIAGNOSTIC DATA (LABS, IMAGING, TESTING) - I reviewed patient records, labs, notes, testing and imaging myself where available.  Lab Results  Component Value Date   WBC 4.9 01/03/2015   HGB 14.6 01/03/2015   HCT 42.0 01/03/2015   MCV 94.4 01/03/2015   PLT 245 01/03/2015      Component Value Date/Time   NA 138 01/03/2015 1515   K 3.7 01/03/2015 1515   CL 105 01/03/2015 1515   CO2 22 01/03/2015 1515   GLUCOSE 99 01/03/2015 1515   BUN 7 01/03/2015 1515   CREATININE 0.65 01/03/2015 1515   CALCIUM 10.1 01/03/2015 1515   PROT 6.8 02/08/2015 0828   ALBUMIN 4.0 02/08/2015 0828   AST 15 02/08/2015 0828   ALT 11 02/08/2015 0828   ALKPHOS 70 02/08/2015 0828   BILITOT 0.8 02/08/2015 0828   GFRNONAA >90 01/03/2015 1515   GFRAA >90 01/03/2015 1515   Lab Results  Component Value Date   CHOL 244* 02/08/2015   HDL 79.50 02/08/2015   LDLCALC 135* 02/08/2015   LDLDIRECT 120.5 07/30/2011   TRIG 147.0 02/08/2015   CHOLHDL 3 02/08/2015   Lab Results  Component Value Date   HGBA1C 5.6 04/17/2014   Lab Results  Component Value Date   VITAMINB12 1432* 08/08/2014   Lab Results  Component Value Date   TSH 3.27 01/01/2015      ASSESSMENT AND PLAN 64 y.o. year old female  has a past medical history of GERD (gastroesophageal reflux disease);  History of esophageal ulcer; Hyperlipidemia; Osteoporosis; Fracture; Melasma; Primiparous; DJD  (degenerative joint disease); Depression; Sleep disorder; B12 deficiency; Fibromyalgia; Arthritis; Dislocated shoulder; Hypothyroidism; Chronic back pain; Peripheral neuropathy; and Hypertension. here with:  1. Peripheral neuropathy 2. Vitamin B12 deficiency. 3. Abnormality of gait  Overall the patient is doing well. She states the gabapentin is controlling her discomfort associated with peripheral neuropathy. I will refill today. She continues to take vitamin B12 supplements daily. Advised the patient that she should continue using her walker or cane to help prevent falls. She verbalized understanding. If her symptoms worsen or she develops new symptoms she she'll let us know. Otherwise she'll follow-up in 6 months or sooner if needed.  Butch PennyMegan Pride Gonzales, MSN, NP-C 02/26/2015, 1:39 PM [facility] [address], [address] [place], [place] [address] [phone]  Note: This document was prepared with digital dictation and possible smart phrase technology. Any transcriptional errors that result from this process are unintentional.

## 2015-02-26 NOTE — Progress Notes (Signed)
I agree with the above plan 

## 2015-03-02 ENCOUNTER — Ambulatory Visit (INDEPENDENT_AMBULATORY_CARE_PROVIDER_SITE_OTHER): Payer: 59 | Admitting: Internal Medicine

## 2015-03-02 DIAGNOSIS — R0602 Shortness of breath: Secondary | ICD-10-CM

## 2015-03-02 DIAGNOSIS — R0789 Other chest pain: Secondary | ICD-10-CM

## 2015-03-02 LAB — PULMONARY FUNCTION TEST
DL/VA % PRED: 118 %
DL/VA: 5.23 ml/min/mmHg/L
DLCO unc % pred: 89 %
DLCO unc: 18.08 ml/min/mmHg
FEF 25-75 PRE: 1.25 L/s
FEF2575-%Pred-Pre: 60 %
FEV1-%PRED-PRE: 60 %
FEV1-PRE: 1.35 L
FEV1FVC-%PRED-PRE: 101 %
FEV6-%Pred-Pre: 61 %
FEV6-Pre: 1.69 L
FEV6FVC-%Pred-Pre: 103 %
FVC-%PRED-PRE: 59 %
FVC-Pre: 1.71 L
PRE FEV1/FVC RATIO: 79 %
Pre FEV6/FVC Ratio: 99 %
RV % pred: 115 %
RV: 2.18 L
TLC % pred: 96 %
TLC: 4.43 L

## 2015-03-02 NOTE — Progress Notes (Signed)
PFT done today. 

## 2015-03-05 ENCOUNTER — Other Ambulatory Visit: Payer: Self-pay | Admitting: Internal Medicine

## 2015-03-06 ENCOUNTER — Telehealth: Payer: Self-pay | Admitting: Family Medicine

## 2015-03-06 NOTE — Telephone Encounter (Signed)
Called to the pharmacy and left on machine. 

## 2015-03-06 NOTE — Telephone Encounter (Signed)
Spoke to the pt about her PFT results.  She does not want to see pulmonary at this time.  [facility]. Please advise.  Thanks!

## 2015-03-06 NOTE — Telephone Encounter (Signed)
Ok x 1

## 2015-03-08 ENCOUNTER — Telehealth: Payer: Self-pay | Admitting: *Deleted

## 2015-03-08 NOTE — Telephone Encounter (Signed)
Patient came into office today. Patient stated she is hearing things, "like air is leaking" in her left ear. Symptoms began yesterday while she was watching tv. Asked for symptoms and patient stated she has none besides intermittent hearing of air leaking. I asked what that meant/sounded like and she said it was hard to explain. Patient denied gait changes and denied vision changes. Grips were present and equal. Subjectively patient appeared well and in no acute distress. [facility] and he agreed patient should be okay until appointment on Monday she already has with MD Panosh for B12 injection. However, will route this walk-in to MD Panosh to address with patient during her visit on Monday.   Also advised to patient if symptoms do occur throughout the weekend to be seen in urgent care or ED. Patient verbalized understanding.  Walk-in vitals: BP 138/70 HR 77 RR 16 O2 98% on RA

## 2015-03-12 ENCOUNTER — Encounter: Payer: Self-pay | Admitting: Internal Medicine

## 2015-03-12 ENCOUNTER — Ambulatory Visit: Payer: 59 | Admitting: Family Medicine

## 2015-03-12 ENCOUNTER — Ambulatory Visit (INDEPENDENT_AMBULATORY_CARE_PROVIDER_SITE_OTHER): Payer: 59 | Admitting: Family Medicine

## 2015-03-12 ENCOUNTER — Ambulatory Visit (INDEPENDENT_AMBULATORY_CARE_PROVIDER_SITE_OTHER): Payer: 59 | Admitting: Internal Medicine

## 2015-03-12 VITALS — BP 132/84 | Temp 97.3°F | Ht 65.0 in | Wt 111.8 lb

## 2015-03-12 DIAGNOSIS — G629 Polyneuropathy, unspecified: Secondary | ICD-10-CM

## 2015-03-12 DIAGNOSIS — H938X2 Other specified disorders of left ear: Secondary | ICD-10-CM | POA: Diagnosis not present

## 2015-03-12 DIAGNOSIS — E538 Deficiency of other specified B group vitamins: Secondary | ICD-10-CM | POA: Diagnosis not present

## 2015-03-12 DIAGNOSIS — D519 Vitamin B12 deficiency anemia, unspecified: Secondary | ICD-10-CM | POA: Diagnosis not present

## 2015-03-12 DIAGNOSIS — F4323 Adjustment disorder with mixed anxiety and depressed mood: Secondary | ICD-10-CM

## 2015-03-12 DIAGNOSIS — H9192 Unspecified hearing loss, left ear: Secondary | ICD-10-CM | POA: Diagnosis not present

## 2015-03-12 MED ORDER — CYANOCOBALAMIN 1000 MCG/ML IJ SOLN
1000.0000 ug | Freq: Once | INTRAMUSCULAR | Status: AC
  Administered 2015-03-12: 1000 ug via INTRAMUSCULAR

## 2015-03-12 MED ORDER — FLUTICASONE FUROATE-VILANTEROL 100-25 MCG/INH IN AEPB: INHALATION_SPRAY | RESPIRATORY_TRACT | Status: DC

## 2015-03-12 NOTE — Progress Notes (Signed)
Pre visit review using our clinic review tool, if applicable. No additional management support is needed unless otherwise documented below in the visit note.   Chief Complaint  Patient presents with  . Hearing Problem    Left Ear    HPI: Patient Adriana Figueroa  comes in today for SDA for  new problem evaluation. See phone note  Problem with left ear  Hearing  sounds and  Hard to hear  Was watching dr Neil Crouchz and put drop used mineral oil.  And then the sx began like a gas leak in her ear  ,  No current pain fever  Sx .  Not taking lorazepam' had a n alcoholic beverage yesterday  Not today.  hasn't seen counselor  Still struggling  Cries at times but doing better  Prefers not to take lots of meds and see a lot of doctors ROS: See pertinent positives and negatives per HPI.  Also see pfts  Has labs cw mod copd couldn't finish test so see if reversible  Declines seeing specialist   Will take inhaler as a trial   Past Medical History  Diagnosis Date  . GERD (gastroesophageal reflux disease)     on nexium since 1989 ? had esoph ulceration reported egd not in record  . History of esophageal ulcer   . Hyperlipidemia   . Osteoporosis     by dexa in the past failed orals has GERD last dexa 4/11 -1.4, -1.5 relast Jan 2010  . Fracture     hx in the past  . 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 Primiparous   . DJD (degenerative joint disease)     MRI spine DJD 2011  ack surgery consult.  . Depression     [facility]  . Sleep disorder     decreased  . B12 deficiency     gives her own shots  . Fibromyalgia     ? who made this dx. ? rheum?  Marland Kitchen. Arthritis   . Dislocated shoulder     2008, Constant shoulder pain   . Hypothyroidism   . Chronic back pain   . Peripheral neuropathy     walks with cane, walker  . Hypertension     Family History  Problem Relation Age of Onset  . Hypertension Mother   . Osteoporosis Mother   . Kidney failure Mother   . Aneurysm Father      Brain    History   Social History  . Marital Status: Divorced    Spouse Name: N/A  . Number of Children: 0  . Years of Education: BS and MBA   Social History Main Topics  . Smoking status: Never Smoker   . Smokeless tobacco: Never Used  . Alcohol Use: Yes     Comment: OCCASIONAL  . Drug Use: No  . Sexual Activity: Not on file   Other Topics Concern  . 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    Outpatient Encounter Prescriptions as of 03/12/2015  Medication Sig  . Calcium Citrate-Vitamin D (CITRACAL + D PO)  Take 1 tablet by mouth daily.  . celecoxib (CELEBREX) 200 MG capsule Take 200 mg by mouth 2 (two) times daily.  Marland Kitchen FOLIC ACID PO Take 1 tablet by mouth daily.  Marland Kitchen gabapentin (NEURONTIN) 800 MG tablet Take 1 tablet (800 mg total) by mouth 4 (four) times daily.  Marland Kitchen LORazepam (ATIVAN) 0.5 MG tablet TAKE ONE TABLET BY MOUTH THREE TIMES DAILY AS NEEDED FOR ANXIETY.  (LIMIT REGULAR USE)  . MAGNESIUM PO Take 1 tablet by mouth daily.  . Multiple Vitamin (MULTIVITAMIN WITH MINERALS) TABS tablet Take 1 tablet by mouth daily.  . Omega-3 Fatty Acids (FISH OIL PO) Take 1 capsule by mouth daily.  . pantoprazole (PROTONIX) 40 MG tablet TAKE ONE TABLET BY MOUTH ONCE DAILY  . traMADol (ULTRAM) 50 MG tablet Take 1 tablet (50 mg total) by mouth every 6 (six) hours as needed (pain).    EXAM:  BP 132/84 mmHg  Temp(Src) 97.3 F (36.3 C) (Temporal)  Ht  (1.651 m)  Wt 111 lb 12.8 oz (50.712 kg)  BMI 18.60 kg/m2  Body mass index is 18.6 kg/(m^2).  GENERAL: vitals reviewed and listed above, alert, oriented, appears well hydrated and in no acute distress sometimes talks loudly ( no changes  0 no acute distress HEENT: atraumatic, conjunctiva  clear, no obvious abnormalities on inspection of external nose and ears  tms   Intact flushed bilaterally small amunt wax pars flaccida area no FB seen  NECK: no obvious masses on inspection palpation  Walking with cane neuropathy walk better than in past MS: moves all extremities without noticeable focal  Abnormality gait better than usual falt footed  PSYCH: pleasant and cooperative,    uneven loudness of voice. ( no change) Hearing screen right  30 - db except 40 at 1500 left not hearing   except one tone ant 50 db 1000  ASSESSMENT AND PLAN:  Discussed the following assessment and plan:  Hearing deficit, left  sound in ear, left - uncertain cause  after bginning the MO  no rx today fu in few weeks if cont she has hearing loss by screen assymmetrical   Adjustment disorder with mixed anxiety and depressed mood  Neuropathy  Anemia, B12 deficiency (plan tox screen at next visit  May be due ) -Patient advised to return or notify health care team  if symptoms worsen ,persist or new concerns arise.  Copd by pfts will send in  Inhaler for daily use to see how does  prob needs ent exam etc but  Pt reluctant  At this time  Will waitn and nothing in  Ear until better .   Patient Instructions  Your hearing has decreased   Left side   But exam   Is not alarming .   nothing in the ear  And if not better in  2 weeks then ROV  Will  Working  sending in   Inhaler  About the breathing by tomorrow.   ROV in 1 months or as needed     Neta Mends. Panosh M.D.

## 2015-03-12 NOTE — Telephone Encounter (Signed)
Pt does not have a visit on Monday just an injection iwht nursw I will not be able to see her at the injection time based on booked schedule.  She can come in in the afternoon   3 pm appt    Please contact her about this

## 2015-03-12 NOTE — Patient Instructions (Signed)
Your hearing has decreased   Left side   But exam   Is not alarming .   nothing in the ear  And if not better in  2 weeks then ROV  Will  Working  sending in   Inhaler  About the breathing by tomorrow.   ROV in 1 months or as needed

## 2015-03-12 NOTE — Telephone Encounter (Signed)
Attempted to call pt to have pt come in a 3 pm today and pt can get her injection at that time as well.

## 2015-03-12 NOTE — Telephone Encounter (Signed)
i snet in inhaler after talking to her at visit and  Plan rov in a month

## 2015-03-12 NOTE — Telephone Encounter (Signed)
Pt seen in OV on 03/12/15

## 2015-03-30 ENCOUNTER — Ambulatory Visit: Payer: Self-pay | Admitting: Family Medicine

## 2015-04-20 ENCOUNTER — Ambulatory Visit (INDEPENDENT_AMBULATORY_CARE_PROVIDER_SITE_OTHER): Payer: 59 | Admitting: Family Medicine

## 2015-04-20 DIAGNOSIS — D519 Vitamin B12 deficiency anemia, unspecified: Secondary | ICD-10-CM | POA: Diagnosis not present

## 2015-04-20 MED ORDER — CYANOCOBALAMIN 1000 MCG/ML IJ SOLN
1000.0000 ug | Freq: Once | INTRAMUSCULAR | Status: AC
  Administered 2015-04-20: 1000 ug via INTRAMUSCULAR

## 2015-05-04 ENCOUNTER — Ambulatory Visit (INDEPENDENT_AMBULATORY_CARE_PROVIDER_SITE_OTHER): Payer: 59 | Admitting: Family Medicine

## 2015-05-04 DIAGNOSIS — D519 Vitamin B12 deficiency anemia, unspecified: Secondary | ICD-10-CM

## 2015-05-04 MED ORDER — CYANOCOBALAMIN 1000 MCG/ML IJ SOLN
1000.0000 ug | Freq: Once | INTRAMUSCULAR | Status: AC
  Administered 2015-05-04: 1000 ug via INTRAMUSCULAR

## 2015-05-11 ENCOUNTER — Telehealth: Payer: Self-pay | Admitting: Internal Medicine

## 2015-05-11 ENCOUNTER — Encounter: Payer: Self-pay | Admitting: Internal Medicine

## 2015-05-11 NOTE — Telephone Encounter (Signed)
Opened in error

## 2015-05-18 ENCOUNTER — Ambulatory Visit (INDEPENDENT_AMBULATORY_CARE_PROVIDER_SITE_OTHER): Payer: 59 | Admitting: Internal Medicine

## 2015-05-18 ENCOUNTER — Encounter: Payer: Self-pay | Admitting: Internal Medicine

## 2015-05-18 VITALS — BP 136/90 | Temp 98.4°F | Ht 65.0 in | Wt 112.6 lb

## 2015-05-18 DIAGNOSIS — R197 Diarrhea, unspecified: Secondary | ICD-10-CM

## 2015-05-18 DIAGNOSIS — R0602 Shortness of breath: Secondary | ICD-10-CM

## 2015-05-18 DIAGNOSIS — G629 Polyneuropathy, unspecified: Secondary | ICD-10-CM

## 2015-05-18 DIAGNOSIS — D519 Vitamin B12 deficiency anemia, unspecified: Secondary | ICD-10-CM

## 2015-05-18 DIAGNOSIS — K921 Melena: Secondary | ICD-10-CM | POA: Diagnosis not present

## 2015-05-18 DIAGNOSIS — M810 Age-related osteoporosis without current pathological fracture: Secondary | ICD-10-CM

## 2015-05-18 NOTE — Patient Instructions (Signed)
Will arrange referral to  GI doctor.   Stop the pepto bismol as this can make stools back and hard to tell what is going onj .  Uncertain if this can be a  Irritable  bowel situation but.   Uncertain  Can try immodium when have to go out   And  Not close to bathroom .   Will contacted about  Gi consult.  If needed can write a letter for b12 medical necessity.

## 2015-05-18 NOTE — Assessment & Plan Note (Signed)
Seems to  Have improvement  onbreo   inhalers expensive coupon given

## 2015-05-18 NOTE — Progress Notes (Signed)
Pre visit review using our clinic review tool, if applicable. No additional management support is needed unless otherwise documented below in the visit note.  Chief Complaint  Patient presents with  . Follow-up    HPI: Adriana Figueroa 64 y.o. comes in because of ongoing GI difficulties. She states she has had this problem for almost 2 years but it is worse now and feels like she can't eat anything without it "going through her" she has cut out a lot of her dairy products tried Lact-Aid a number of other things.  Stool is black and tarry described as Frequent stools.loose and has urgency after eating  For 2 years and worse in past 2 months 10 times . No blood she's been taking Pepto-Bismol on a regular basis and just stopped this a few days ago. She is also stop the proton aches and the magnesium. Basically all she is taking is her inhaler and her B12 shots. In addition she has stopped the tramadol states the Ativan she is not taking and is "on the shelf". And is going off the gabapentin. She is on her B12 shots. She is also taking [place] which is significantly helped her shortness of breath however the cost is $80 and is going to be hard to continue. She is also on prolabia and is going through process because of the cost. He is coming in for her regular B12 shots her neuropathy is no worse she's able to walk very flat-footed has a cane able to drive because she can lift her legs up pretty well.  Feels pretty isolated but is trying has her face and plans on seeing a counselor. She has a high deductible and touch of her care is costing. ROS: See pertinent positives and negatives per HPI.  Past Medical History  Diagnosis Date  . GERD (gastroesophageal reflux disease)     on nexium since 1989 ? had esoph ulceration reported egd not in record  . History of esophageal ulcer   . Hyperlipidemia   . Osteoporosis     by dexa in the past failed orals has GERD last dexa 4/11 -1.4, -1.5 relast Jan 2010   . Fracture     hx in the past  . 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 Primiparous   . DJD (degenerative joint disease)     MRI spine DJD 2011  ack surgery consult.  . Depression     [facility]  . Sleep disorder     decreased  . B12 deficiency     gives her own shots  . Fibromyalgia     ? who made this dx. ? rheum?  Marland Kitchen Arthritis   . Dislocated shoulder     2008, Constant shoulder pain   . Hypothyroidism   . Chronic back pain   . Peripheral neuropathy     walks with cane, walker  . Hypertension     Family History  Problem Relation Age of Onset  . Hypertension Mother   . Osteoporosis Mother   . Kidney failure Mother   . Aneurysm Father     Brain    History   Social History  . Marital Status: Divorced    Spouse Name: N/A  . Number of Children: 0  . Years of Education: BS and MBA   Social History Main Topics  . Smoking status: Never Smoker   . Smokeless tobacco: Never Used  . Alcohol Use: Yes  Comment: OCCASIONAL  . Drug Use: No  . Sexual Activity: Not on file   Other Topics Concern  . 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    Outpatient Prescriptions Prior to Visit  Medication Sig Dispense Refill  . Fluticasone Furoate-Vilanterol (BREO ELLIPTA) 100-25 MCG/INH AEPB 1 inhalation once a day 1 each 3  . Multiple Vitamin (MULTIVITAMIN WITH MINERALS) TABS tablet Take 1 tablet by mouth daily.    . celecoxib (CELEBREX) 200 MG capsule Take 200 mg by mouth 2 (two) times daily.    Marland Kitchen. gabapentin (NEURONTIN) 800 MG tablet Take 1 tablet (800 mg total) by mouth 4 (four) times daily. 120 tablet 5  . LORazepam (ATIVAN) 0.5 MG tablet TAKE ONE TABLET  BY MOUTH THREE TIMES DAILY AS NEEDED FOR ANXIETY.  (LIMIT REGULAR USE) 24 tablet 0  . MAGNESIUM PO Take 1 tablet by mouth daily.    . Calcium Citrate-Vitamin D (CITRACAL + D PO) Take 1 tablet by mouth daily.    Marland Kitchen. FOLIC ACID PO Take 1 tablet by mouth daily.    . pantoprazole (PROTONIX) 40 MG tablet TAKE ONE TABLET BY MOUTH ONCE DAILY (Patient not taking: Reported on 05/18/2015) 30 tablet 5  . traMADol (ULTRAM) 50 MG tablet Take 1 tablet (50 mg total) by mouth every 6 (six) hours as needed (pain). (Patient not taking: Reported on 05/18/2015) 30 tablet 1  . Omega-3 Fatty Acids (FISH OIL PO) Take 1 capsule by mouth daily.     No facility-administered medications prior to visit.     EXAM:  BP 136/90 mmHg  Temp(Src) 98.4 F (36.9 C) (Oral)  Ht 5\' 5"  (1.651 m)  Wt 112 lb 9.6 oz (51.075 kg)  BMI 18.74 kg/m2  Body mass index is 18.74 kg/(m^2).  GENERAL: vitals reviewed and listed above, alert, oriented, somewhat hard of hearing loud voices good eye contact appears well hydrated and in no acute distress walks very flat-footed but independent otherwise has a cane HEENT: atraumatic, conjunctiva  clear, no obvious abnormalities on inspection of external nose and earsNECK: no obvious masses on inspection palpation  LUNGS: clear to auscultation bilaterally, no wheezes, rales or rhonchi, good air movement CV: HRRR, no clubbing cyanosis or  peripheral edema nl cap refill  unable to get up on the table to do abdominal exam points to her upper abdomen and mid lower as area of discomfort.  MS: moves all extremities  PSYCH: pleasant and cooperative,  Lab Results  Component Value Date   WBC 4.9 01/03/2015   HGB 14.6 01/03/2015   HCT 42.0 01/03/2015   PLT 245 01/03/2015   GLUCOSE 99 01/03/2015   CHOL 244* 02/08/2015   TRIG 147.0 02/08/2015   HDL 79.50 02/08/2015   LDLDIRECT 120.5 07/30/2011   LDLCALC 135* 02/08/2015   ALT 11 02/08/2015   AST 15 02/08/2015   NA 138 01/03/2015   K 3.7 01/03/2015   CL  105 01/03/2015   CREATININE 0.65 01/03/2015   BUN 7 01/03/2015   CO2 22 01/03/2015   TSH 3.27 01/01/2015   INR 0.88 09/20/2010   HGBA1C 5.6 04/17/2014   Lab  Results  Component Value Date   FERRITIN 121.8 02/08/2015   Wt Readings from Last 3 Encounters:  05/18/15 112 lb 9.6 oz (51.075 kg)  03/12/15 111 lb 12.8 oz (50.712 kg)  02/26/15 114 lb (51.71 kg)   BP Readings from Last 3 Encounters:  05/18/15 136/90  03/12/15 132/84  02/26/15 127/82     ASSESSMENT AND PLAN:  Discussed the following assessment and plan:  Frequent loose stools - post prand  bl postprandial  blad tarry may be pept at risk for ulcer d ? ibs ? malabsorptino  basicall stipped all meds but b12 and in haler  no change  - Plan: Ambulatory referral to Gastroenterology  Black stool - prob from pepto had some in hosp and was not anemic then  - Plan: Ambulatory referral to Gastroenterology  B12 deficiency anemia - related to neuropathy also injectinos are  medically necessary  .  - Plan: Ambulatory referral to Gastroenterology  Neuropathy - comlicating mobility  able to drive.   Shortness of breath/copd - coupon for breo better on rx   Osteoporosis - on prolia  uncertain cause she has a remote history of esophageal ulcer suspect she could have an ulcer do not think the tarry stools are from blood without a history of recent anemia and hospitalization could be from the Pepto-Bismol however her postprandial loose stools and diarrhea problematic consider malabsorption has stopped all oral medicines and hasn't improved.  Although she is hesitant to do any procedures he comes of anxiety cost past history she agrees to see gastroenterology  To get some direction in a vice. Fortunately she has not lost a lot of weight. She does have osteoporosis and needs to maintain on the B12 injections. Has used alcohol in the past but not in the present. -Patient advised to return or notify health care team  if symptoms worsen ,persist  or new concerns arise. Not taking protonix   coupon given for breo to defer cost  Gi consult  Patient Instructions  Will arrange referral to  GI doctor.   Stop the pepto bismol as this can make stools back and hard to tell what is going onj .  Uncertain if this can be a  Irritable  bowel situation but.   Uncertain  Can try immodium when have to go out   And  Not close to bathroom .   Will contacted about  Gi consult.  If needed can write a letter for b12 medical necessity.    Neta Mends. Panosh M.D.

## 2015-06-01 ENCOUNTER — Ambulatory Visit (INDEPENDENT_AMBULATORY_CARE_PROVIDER_SITE_OTHER): Payer: 59 | Admitting: Family Medicine

## 2015-06-01 DIAGNOSIS — E538 Deficiency of other specified B group vitamins: Secondary | ICD-10-CM

## 2015-06-01 MED ORDER — CYANOCOBALAMIN 1000 MCG/ML IJ SOLN
1000.0000 ug | Freq: Once | INTRAMUSCULAR | Status: AC
  Administered 2015-06-01: 1000 ug via INTRAMUSCULAR

## 2015-06-07 ENCOUNTER — Telehealth: Payer: Self-pay | Admitting: Internal Medicine

## 2015-06-07 NOTE — Telephone Encounter (Signed)
Spoke to [employer], no further action required.

## 2015-06-07 NOTE — Telephone Encounter (Signed)
Called [employer] to obtain benefit on patients Prolia Injection. Per call reference # [id number] pt has a 20% coins after she meets her $3600 deductible. I had them look up the fee schedule for [phone] and the allowable rate provided was $28.49. Pt is eligible to have injection done in the office by Dr Fabian Sharp and it does not require authorization.

## 2015-06-15 ENCOUNTER — Ambulatory Visit (INDEPENDENT_AMBULATORY_CARE_PROVIDER_SITE_OTHER): Payer: 59 | Admitting: Family Medicine

## 2015-06-15 DIAGNOSIS — D519 Vitamin B12 deficiency anemia, unspecified: Secondary | ICD-10-CM

## 2015-06-15 MED ORDER — CYANOCOBALAMIN 1000 MCG/ML IJ SOLN
1000.0000 ug | Freq: Once | INTRAMUSCULAR | Status: AC
  Administered 2015-06-15: 1000 ug via INTRAMUSCULAR

## 2015-06-28 ENCOUNTER — Encounter: Payer: Self-pay | Admitting: *Deleted

## 2015-06-29 ENCOUNTER — Ambulatory Visit (INDEPENDENT_AMBULATORY_CARE_PROVIDER_SITE_OTHER): Payer: 59 | Admitting: Family Medicine

## 2015-06-29 DIAGNOSIS — D519 Vitamin B12 deficiency anemia, unspecified: Secondary | ICD-10-CM | POA: Diagnosis not present

## 2015-06-29 MED ORDER — CYANOCOBALAMIN 1000 MCG/ML IJ SOLN
1000.0000 ug | Freq: Once | INTRAMUSCULAR | Status: AC
  Administered 2015-06-29: 1000 ug via INTRAMUSCULAR

## 2015-07-11 ENCOUNTER — Ambulatory Visit (INDEPENDENT_AMBULATORY_CARE_PROVIDER_SITE_OTHER): Payer: 59 | Admitting: Family Medicine

## 2015-07-11 DIAGNOSIS — E538 Deficiency of other specified B group vitamins: Secondary | ICD-10-CM

## 2015-07-11 MED ORDER — CYANOCOBALAMIN 1000 MCG/ML IJ SOLN: 1000.0000 ug | Freq: Once | INTRAMUSCULAR | Status: AC

## 2015-07-11 MED ORDER — CYANOCOBALAMIN 1000 MCG/ML IJ SOLN
1000.0000 ug | Freq: Once | INTRAMUSCULAR | Status: AC
  Administered 2015-07-11: 1000 ug via INTRAMUSCULAR

## 2015-07-12 ENCOUNTER — Ambulatory Visit (INDEPENDENT_AMBULATORY_CARE_PROVIDER_SITE_OTHER): Payer: 59 | Admitting: Family Medicine

## 2015-07-12 DIAGNOSIS — M810 Age-related osteoporosis without current pathological fracture: Secondary | ICD-10-CM

## 2015-07-12 MED ORDER — DENOSUMAB 60 MG/ML ~~LOC~~ SOLN
60.0000 mg | Freq: Once | SUBCUTANEOUS | Status: AC
  Administered 2015-07-12: 60 mg via SUBCUTANEOUS

## 2015-07-16 ENCOUNTER — Ambulatory Visit: Payer: Self-pay | Admitting: Family Medicine

## 2015-07-31 ENCOUNTER — Ambulatory Visit (INDEPENDENT_AMBULATORY_CARE_PROVIDER_SITE_OTHER): Payer: 59 | Admitting: Family Medicine

## 2015-07-31 ENCOUNTER — Telehealth: Payer: Self-pay | Admitting: Internal Medicine

## 2015-07-31 DIAGNOSIS — D518 Other vitamin B12 deficiency anemias: Secondary | ICD-10-CM | POA: Diagnosis not present

## 2015-07-31 MED ORDER — CYANOCOBALAMIN 1000 MCG/ML IJ SOLN
1000.0000 ug | Freq: Once | INTRAMUSCULAR | Status: AC
  Administered 2015-07-31: 1000 ug via INTRAMUSCULAR

## 2015-07-31 NOTE — Telephone Encounter (Signed)
Spoke to the pt.  She wants to know how necessary her B12 injections are.  Her insurance no longer covers the injections.  She is having to pay out of pocket every time she gets her injection.  Could she try sublingual OTC? Please advise.  Thanks!

## 2015-07-31 NOTE — Telephone Encounter (Signed)
i am concerned  theat her neuropathy caould get worse but some people do well on this  But have to be followed   Closely .  We need to get a b12 level  And cbcdiff baseline  Dx neuropathy and b12 deficiency   And then can switch to  1000 mcg  Of B12 sublingual  EVERY DAY  And then recheck b12 level in  3 months or there abouts.

## 2015-07-31 NOTE — Telephone Encounter (Signed)
Pt would like misty to return her call concerning b12.

## 2015-08-01 NOTE — Telephone Encounter (Signed)
Left a message on home number  for the pt to return my call. 

## 2015-08-03 NOTE — Telephone Encounter (Signed)
Spoke to the pt.  Advised that she may start OTC B12 and will need to check her B12 level in 3 months.  Will also need lab work for baseline.  She will stay on current injections.  Neuropathy has recently gotten worse.  She is afraid that it may continue to worsen if oral B12 does not work.  Pt stated that she was watching Dr. Neil Crouch and he said that vitamin c is good for younger looking skin.  Would like to start vitamin c injections.  Wants to check with [facility] to see if this is possible.  Advised the pt that we do not have those injections.  Pt insisted that I speak with WP.  Please advise.  Thanks!

## 2015-08-04 NOTE — Telephone Encounter (Signed)
I would  Not advise taking VITAmin  c injections No credible scientific study to say this is safe or works.  High doses  Of vit c  Can give risk of kidney stones.  Up to date  But I would save  money  And risk of side effects  and eat  Foods high in vitamin C  As this is natural.  Way to get vitamin C.   Make sure avoid regular alcohol as this can effect absorption of vitamins and make the neuropathy worse .

## 2015-08-06 NOTE — Telephone Encounter (Signed)
Spoke to the pt advised against vitamin c due to the below listed reasons.  Pt stated she only consumes alcohol on Saturday night when she watches Saturday Night [employer].

## 2015-08-14 ENCOUNTER — Ambulatory Visit: Payer: 59 | Admitting: Family Medicine

## 2015-08-17 ENCOUNTER — Ambulatory Visit (INDEPENDENT_AMBULATORY_CARE_PROVIDER_SITE_OTHER): Payer: 59 | Admitting: Family Medicine

## 2015-08-17 DIAGNOSIS — Z23 Encounter for immunization: Secondary | ICD-10-CM | POA: Diagnosis not present

## 2015-08-17 DIAGNOSIS — D519 Vitamin B12 deficiency anemia, unspecified: Secondary | ICD-10-CM | POA: Diagnosis not present

## 2015-08-17 MED ORDER — CYANOCOBALAMIN 1000 MCG/ML IJ SOLN
1000.0000 ug | Freq: Once | INTRAMUSCULAR | Status: AC
  Administered 2015-08-17: 1000 ug via INTRAMUSCULAR

## 2015-08-27 ENCOUNTER — Telehealth: Payer: Self-pay | Admitting: Adult Health

## 2015-08-27 NOTE — Telephone Encounter (Signed)
Patient is coming in on Wednesday 08/29/15 but she wanted you two to know that her legs are completely swollen up and that it feels like rocks are on them. Best number is [phone]   °

## 2015-08-27 NOTE — Telephone Encounter (Signed)
Patient has a follow  up apt with Aundra Millet this coming Wednesday . When I was speaking to the patient she stated she is not taking her Gabapentin and she is requesting a diuretic.

## 2015-08-27 NOTE — Telephone Encounter (Signed)
Please see previous message

## 2015-08-27 NOTE — Telephone Encounter (Addendum)
Called and spoke to patient she can barley walk both legs are swollen she is requesting a diuretic.Patient states she not taken her gabapentin today at all and she relayed she will not take until she has her apt this Wednesday with Megan.  Tried to call patient back and relay to her to keep her follow up apt. This Wednesday and take her medication's as she should no voice mail telephone rang and rang I will try back 08/28/2015. Patient should call PCP for diuretic.

## 2015-08-27 NOTE — Telephone Encounter (Signed)
Patient is coming in on Wednesday 08/29/15 but she wanted you two to know that her legs are completely swollen up and that it feels like rocks are on them. Best number is [phone]

## 2015-08-29 ENCOUNTER — Ambulatory Visit: Payer: 59 | Admitting: Adult Health

## 2015-08-31 ENCOUNTER — Ambulatory Visit: Payer: Self-pay | Admitting: Family Medicine

## 2015-08-31 ENCOUNTER — Ambulatory Visit (INDEPENDENT_AMBULATORY_CARE_PROVIDER_SITE_OTHER): Payer: 59 | Admitting: Family Medicine

## 2015-08-31 DIAGNOSIS — E538 Deficiency of other specified B group vitamins: Secondary | ICD-10-CM | POA: Diagnosis not present

## 2015-08-31 MED ORDER — CYANOCOBALAMIN 1000 MCG/ML IJ SOLN
1000.0000 ug | Freq: Once | INTRAMUSCULAR | Status: AC
  Administered 2015-08-31: 1000 ug via INTRAMUSCULAR

## 2015-09-04 ENCOUNTER — Telehealth: Payer: Self-pay | Admitting: Internal Medicine

## 2015-09-04 NOTE — Telephone Encounter (Signed)
Do not remove WP as PCP.  It is unfortunate that the pt does this frequently.

## 2015-09-04 NOTE — Telephone Encounter (Signed)
Adriana Figueroa called yesterday and was very agitated claiming that we have been taking her money and putting on her ex husbands account. She stated that she has a receipt as proof of this and that 'we have really messed her up'. I pulled her recent receipts and verified that payments were posted to her account - not her ex-husbands. I called her this morning to explain that and ask her to bring in the receipt that she has so that I could help her. She was still in an agitated state and screamed that we 'are butchers and she is going elsewhere.' She also demanded that I cancel all of her appointments here and she was never coming back to [name] and told me to never call her home again. I did cancel her appointments as she demanded. Should I remove Dr Fabian Sharp as her PCP?

## 2015-09-05 ENCOUNTER — Ambulatory Visit: Payer: 59 | Admitting: Adult Health

## 2015-09-05 ENCOUNTER — Encounter: Payer: Self-pay | Admitting: Neurology

## 2015-09-10 ENCOUNTER — Telehealth: Payer: Self-pay | Admitting: Internal Medicine

## 2015-09-10 ENCOUNTER — Telehealth: Payer: Self-pay | Admitting: Adult Health

## 2015-09-10 NOTE — Telephone Encounter (Signed)
Please advise 

## 2015-09-10 NOTE — Telephone Encounter (Signed)
Pt called and stated there was a misunderstanding with her insurance by her PCP but this has been resolved.  She is asking if she can make an appt.

## 2015-09-10 NOTE — Telephone Encounter (Signed)
Pt has been dismissed from [facility]. She needs a referral to a new Neurologist.

## 2015-09-10 NOTE — Telephone Encounter (Signed)
Patient has been dismissed from Practice as of last week. Patient can't have apt. Carollee Herter please advise me what to tell patient.

## 2015-09-10 NOTE — Telephone Encounter (Signed)
Only other neuro except [facility] is in [place].    Otherwise Adriana Figueroa which is further away.  She should get someone to drive her .   Ok to refer .

## 2015-09-10 NOTE — Telephone Encounter (Signed)
Called and asked patient did she receive a dismissal letter from our practice. Patient did not allow me to talk patient had  very choice words and a lot of  profanity was so bad I would not repeat . Patient then slammed her phone down.

## 2015-09-11 ENCOUNTER — Other Ambulatory Visit: Payer: Self-pay | Admitting: Neurology

## 2015-09-12 NOTE — Telephone Encounter (Signed)
Pt has been discharged from our practice.   Letter sent to pt, stated that we would give her 30 days of medication we prescribed if needed.  Ok per Dr. Pearlean Brownie.

## 2015-09-14 ENCOUNTER — Ambulatory Visit (INDEPENDENT_AMBULATORY_CARE_PROVIDER_SITE_OTHER): Payer: 59 | Admitting: Family Medicine

## 2015-09-14 ENCOUNTER — Ambulatory Visit: Payer: Self-pay | Admitting: Family Medicine

## 2015-09-14 DIAGNOSIS — E538 Deficiency of other specified B group vitamins: Secondary | ICD-10-CM

## 2015-09-14 MED ORDER — CYANOCOBALAMIN 1000 MCG/ML IJ SOLN
1000.0000 ug | Freq: Once | INTRAMUSCULAR | Status: AC
  Administered 2015-09-14: 1000 ug via INTRAMUSCULAR

## 2015-09-21 NOTE — Progress Notes (Unsigned)
Dr.Sethi did a 30 day refill for Tramadol on 09-12-15. Pt has been dismiss from the practice. Pt cant have anymore refills per Dr.Sethi.She is dismiss from [facility].

## 2015-09-28 ENCOUNTER — Encounter: Payer: Self-pay | Admitting: Internal Medicine

## 2015-09-28 NOTE — Telephone Encounter (Signed)
Pt states she has not heard anything about a referral. Would like referral due to her neuropathy getting worse.

## 2015-10-01 ENCOUNTER — Encounter: Payer: 59 | Admitting: Internal Medicine

## 2015-10-01 NOTE — Progress Notes (Signed)
Document opened and reviewed for cpxOV but appt  canceled same day . Had care trouble

## 2015-10-03 ENCOUNTER — Ambulatory Visit (INDEPENDENT_AMBULATORY_CARE_PROVIDER_SITE_OTHER): Payer: 59 | Admitting: Family Medicine

## 2015-10-03 ENCOUNTER — Other Ambulatory Visit: Payer: Self-pay | Admitting: Family Medicine

## 2015-10-03 DIAGNOSIS — G629 Polyneuropathy, unspecified: Secondary | ICD-10-CM

## 2015-10-03 DIAGNOSIS — E538 Deficiency of other specified B group vitamins: Secondary | ICD-10-CM | POA: Diagnosis not present

## 2015-10-03 DIAGNOSIS — D519 Vitamin B12 deficiency anemia, unspecified: Secondary | ICD-10-CM

## 2015-10-03 LAB — VITAMIN B12: VITAMIN B 12: 1238 pg/mL — AB (ref 211–911)

## 2015-10-03 LAB — CBC WITH DIFFERENTIAL/PLATELET
BASOS ABS: 0 10*3/uL (ref 0.0–0.1)
BASOS PCT: 0.6 % (ref 0.0–3.0)
EOS ABS: 0.1 10*3/uL (ref 0.0–0.7)
Eosinophils Relative: 0.9 % (ref 0.0–5.0)
HEMATOCRIT: 47.5 % — AB (ref 36.0–46.0)
HEMOGLOBIN: 16.1 g/dL — AB (ref 12.0–15.0)
LYMPHS PCT: 52.1 % — AB (ref 12.0–46.0)
Lymphs Abs: 3.2 10*3/uL (ref 0.7–4.0)
MCHC: 33.9 g/dL (ref 30.0–36.0)
MCV: 96.3 fl (ref 78.0–100.0)
MONO ABS: 0.7 10*3/uL (ref 0.1–1.0)
Monocytes Relative: 11.3 % (ref 3.0–12.0)
Neutro Abs: 2.2 10*3/uL (ref 1.4–7.7)
Neutrophils Relative %: 35.1 % — ABNORMAL LOW (ref 43.0–77.0)
Platelets: 197 10*3/uL (ref 150.0–400.0)
RBC: 4.93 Mil/uL (ref 3.87–5.11)
RDW: 14.7 % (ref 11.5–15.5)
WBC: 6.2 10*3/uL (ref 4.0–10.5)

## 2015-10-03 LAB — BASIC METABOLIC PANEL
BUN: 6 mg/dL (ref 6–23)
CALCIUM: 9 mg/dL (ref 8.4–10.5)
CHLORIDE: 99 meq/L (ref 96–112)
CO2: 25 mEq/L (ref 19–32)
CREATININE: 0.49 mg/dL (ref 0.40–1.20)
GFR: 135.55 mL/min (ref >60.00)
Glucose, Bld: 107 mg/dL — ABNORMAL HIGH (ref 70–99)
Potassium: 3.9 mEq/L (ref 3.5–5.1)
Sodium: 140 mEq/L (ref 135–145)

## 2015-10-03 LAB — IBC PANEL
Iron: 164 ug/dL — ABNORMAL HIGH (ref 42–145)
Saturation Ratios: 54.5 % — ABNORMAL HIGH (ref 20.0–50.0)
Transferrin: 215 mg/dL (ref 212.0–360.0)

## 2015-10-03 MED ORDER — CYANOCOBALAMIN 1000 MCG/ML IJ SOLN
1000.0000 ug | Freq: Once | INTRAMUSCULAR | Status: AC
  Administered 2015-10-03: 1000 ug via INTRAMUSCULAR

## 2015-10-09 ENCOUNTER — Telehealth: Payer: Self-pay | Admitting: Internal Medicine

## 2015-10-09 NOTE — Telephone Encounter (Signed)
  See below result note   report to her   b12 is high    Iron levels are high   no iron supplements  Can decrease the b12 shots  In frequency to  Every 6 weeks       Plan repeat labs in 4-6 weeks   IBC, ferritin,  Es,r,  lfts   Dx elevated iron levels

## 2015-10-09 NOTE — Telephone Encounter (Signed)
Patient is calling to get her lab results from 10/19 please.

## 2015-10-10 ENCOUNTER — Other Ambulatory Visit: Payer: Self-pay | Admitting: Family Medicine

## 2015-10-10 DIAGNOSIS — R790 Abnormal level of blood mineral: Secondary | ICD-10-CM

## 2015-10-10 NOTE — Telephone Encounter (Signed)
Pt notified of results by telephone.  Has made a future lab appt.  Orders placed in the system.  Pt has been taking an iron supplement.  Advised that she stop until future labs.

## 2015-10-12 NOTE — Telephone Encounter (Signed)
I called the patient informed her that we

## 2015-10-12 NOTE — Telephone Encounter (Signed)
Gavin PoundDeborah please call Ms Aida PufferFatmi and let her know that we should have appointment information for her by Monday.

## 2015-10-12 NOTE — Telephone Encounter (Signed)
I called pt  To inform her that I sent her referral t neurology to  [facility] Address: [address], [place], [place] [address] Phone: [phone] Faxed to [phone]  Their office will inform our office of pt scheduled apt once her referral is review pt was aware

## 2015-10-12 NOTE — Telephone Encounter (Signed)
Faxed referral / notes for neurology to  [facility] Address: [address], [place], [place] [address] Phone: [phone]  Faxed to  [phone]  Their office will inform our office of pt scheduled appt

## 2015-10-25 ENCOUNTER — Telehealth: Payer: Self-pay | Admitting: *Deleted

## 2015-10-25 NOTE — Telephone Encounter (Signed)
Left message for patient to call the office, patient needs to sign a release form.

## 2015-10-26 ENCOUNTER — Other Ambulatory Visit (INDEPENDENT_AMBULATORY_CARE_PROVIDER_SITE_OTHER): Payer: 59

## 2015-10-26 DIAGNOSIS — R790 Abnormal level of blood mineral: Secondary | ICD-10-CM | POA: Diagnosis not present

## 2015-10-26 LAB — HEPATIC FUNCTION PANEL
ALK PHOS: 50 U/L (ref 39–117)
ALT: 44 U/L — AB (ref 0–35)
AST: 70 U/L — ABNORMAL HIGH (ref 0–37)
Albumin: 3.8 g/dL (ref 3.5–5.2)
BILIRUBIN DIRECT: 0.6 mg/dL — AB (ref 0.0–0.3)
TOTAL PROTEIN: 6.3 g/dL (ref 6.0–8.3)
Total Bilirubin: 1.5 mg/dL — ABNORMAL HIGH (ref 0.2–1.2)

## 2015-10-26 LAB — IBC PANEL
Iron: 55 ug/dL (ref 42–145)
Saturation Ratios: 18.7 % — ABNORMAL LOW (ref 20.0–50.0)
Transferrin: 210 mg/dL — ABNORMAL LOW (ref 212.0–360.0)

## 2015-10-26 LAB — FERRITIN: Ferritin: 481.9 ng/mL — ABNORMAL HIGH (ref 10.0–291.0)

## 2015-10-26 LAB — SEDIMENTATION RATE: Sed Rate: 8 mm/hr (ref 0–22)

## 2015-10-29 ENCOUNTER — Telehealth: Payer: Self-pay | Admitting: Internal Medicine

## 2015-10-29 NOTE — Telephone Encounter (Signed)
Pt had to cancelled her cpx on 10-03-15 due to tree falling in her yard and she was unable to get off. Can I create 30 min slot before end of year?

## 2015-10-31 NOTE — Telephone Encounter (Signed)
Misty please ask dr Fabian Sharppanosh if I can work this patient in

## 2015-11-01 ENCOUNTER — Telehealth: Payer: Self-pay | Admitting: Internal Medicine

## 2015-11-01 ENCOUNTER — Other Ambulatory Visit: Payer: Self-pay | Admitting: Internal Medicine

## 2015-11-01 NOTE — Telephone Encounter (Signed)
Please call patient with her most recent lab work results. Has been waiting since Monday.

## 2015-11-01 NOTE — Telephone Encounter (Signed)
lmom for pt to call back

## 2015-11-01 NOTE — Telephone Encounter (Signed)
Pt has been sch for 12-14-15

## 2015-11-01 NOTE — Telephone Encounter (Signed)
Yes as long as there are at least 3-4 open slots on that day

## 2015-11-01 NOTE — Telephone Encounter (Signed)
See result note.  

## 2015-11-01 NOTE — Telephone Encounter (Signed)
°  Called pt to see if she has been scheduled . [facility] , and she has been speaking with Treva . I called the office and spoke with Treva informed me she and the pt has been playing phone tag and she informed me that the pt will see  [facility]: Macon LargeWillis Leanne K MD Address: [address], [place], [place] [address] [phone] She did not give me a time because she  Will have to talk to Mrs Aida PufferFatmi first , treva stated she will inform them pt of her Appointment .

## 2015-11-01 NOTE — Telephone Encounter (Signed)
Need results.

## 2015-11-02 ENCOUNTER — Other Ambulatory Visit: Payer: Self-pay | Admitting: Internal Medicine

## 2015-11-06 ENCOUNTER — Telehealth: Payer: Self-pay | Admitting: Internal Medicine

## 2015-11-06 NOTE — Telephone Encounter (Signed)
Adriana Figueroa called saying she needs a refill of Lorazepam sent to her pharmacy. Please give her a call if she needs to come in.   Pt ph# [phone] Thank you.

## 2015-11-06 NOTE — Telephone Encounter (Signed)
Called to the pharmacy and left on machine. 

## 2015-11-06 NOTE — Telephone Encounter (Signed)
Duplicate Request.

## 2015-11-06 NOTE — Telephone Encounter (Signed)
Can dispense  # 15  Want to avoid regular use She has a visit end of December

## 2015-11-12 ENCOUNTER — Telehealth: Payer: Self-pay | Admitting: Internal Medicine

## 2015-11-12 ENCOUNTER — Other Ambulatory Visit: Payer: Self-pay

## 2015-11-12 NOTE — Telephone Encounter (Signed)
Pt is calling because her ankles are swollen. States she has a hard time putting on her shoes. Started two days ago. Has been keeping them elevated and doing soaks in warm water and epsom salts also. Would like to know if you would consider a diuretics?

## 2015-11-12 NOTE — Telephone Encounter (Signed)
sch for 11/13/15

## 2015-11-13 ENCOUNTER — Encounter: Payer: Self-pay | Admitting: Internal Medicine

## 2015-11-13 NOTE — Progress Notes (Signed)
Document opened and reviewed for OV but appt  NS same day . See notes

## 2015-12-14 ENCOUNTER — Encounter: Payer: Self-pay | Admitting: Internal Medicine

## 2016-01-14 ENCOUNTER — Ambulatory Visit (INDEPENDENT_AMBULATORY_CARE_PROVIDER_SITE_OTHER): Payer: No Typology Code available for payment source | Admitting: Family Medicine

## 2016-01-14 DIAGNOSIS — M810 Age-related osteoporosis without current pathological fracture: Secondary | ICD-10-CM

## 2016-01-14 MED ORDER — DENOSUMAB 60 MG/ML ~~LOC~~ SOLN
60.0000 mg | Freq: Once | SUBCUTANEOUS | Status: AC
  Administered 2016-01-14: 60 mg via SUBCUTANEOUS

## 2016-01-15 IMAGING — CR DG CHEST 2V
2 series · 2 of 2 positions shown · non-contrast
Comparison: CT chest 01/02/2015

CLINICAL DATA: Dysphagia for 1 week, pain with inspiration

EXAM:
CHEST  2 VIEW

[chest pa]
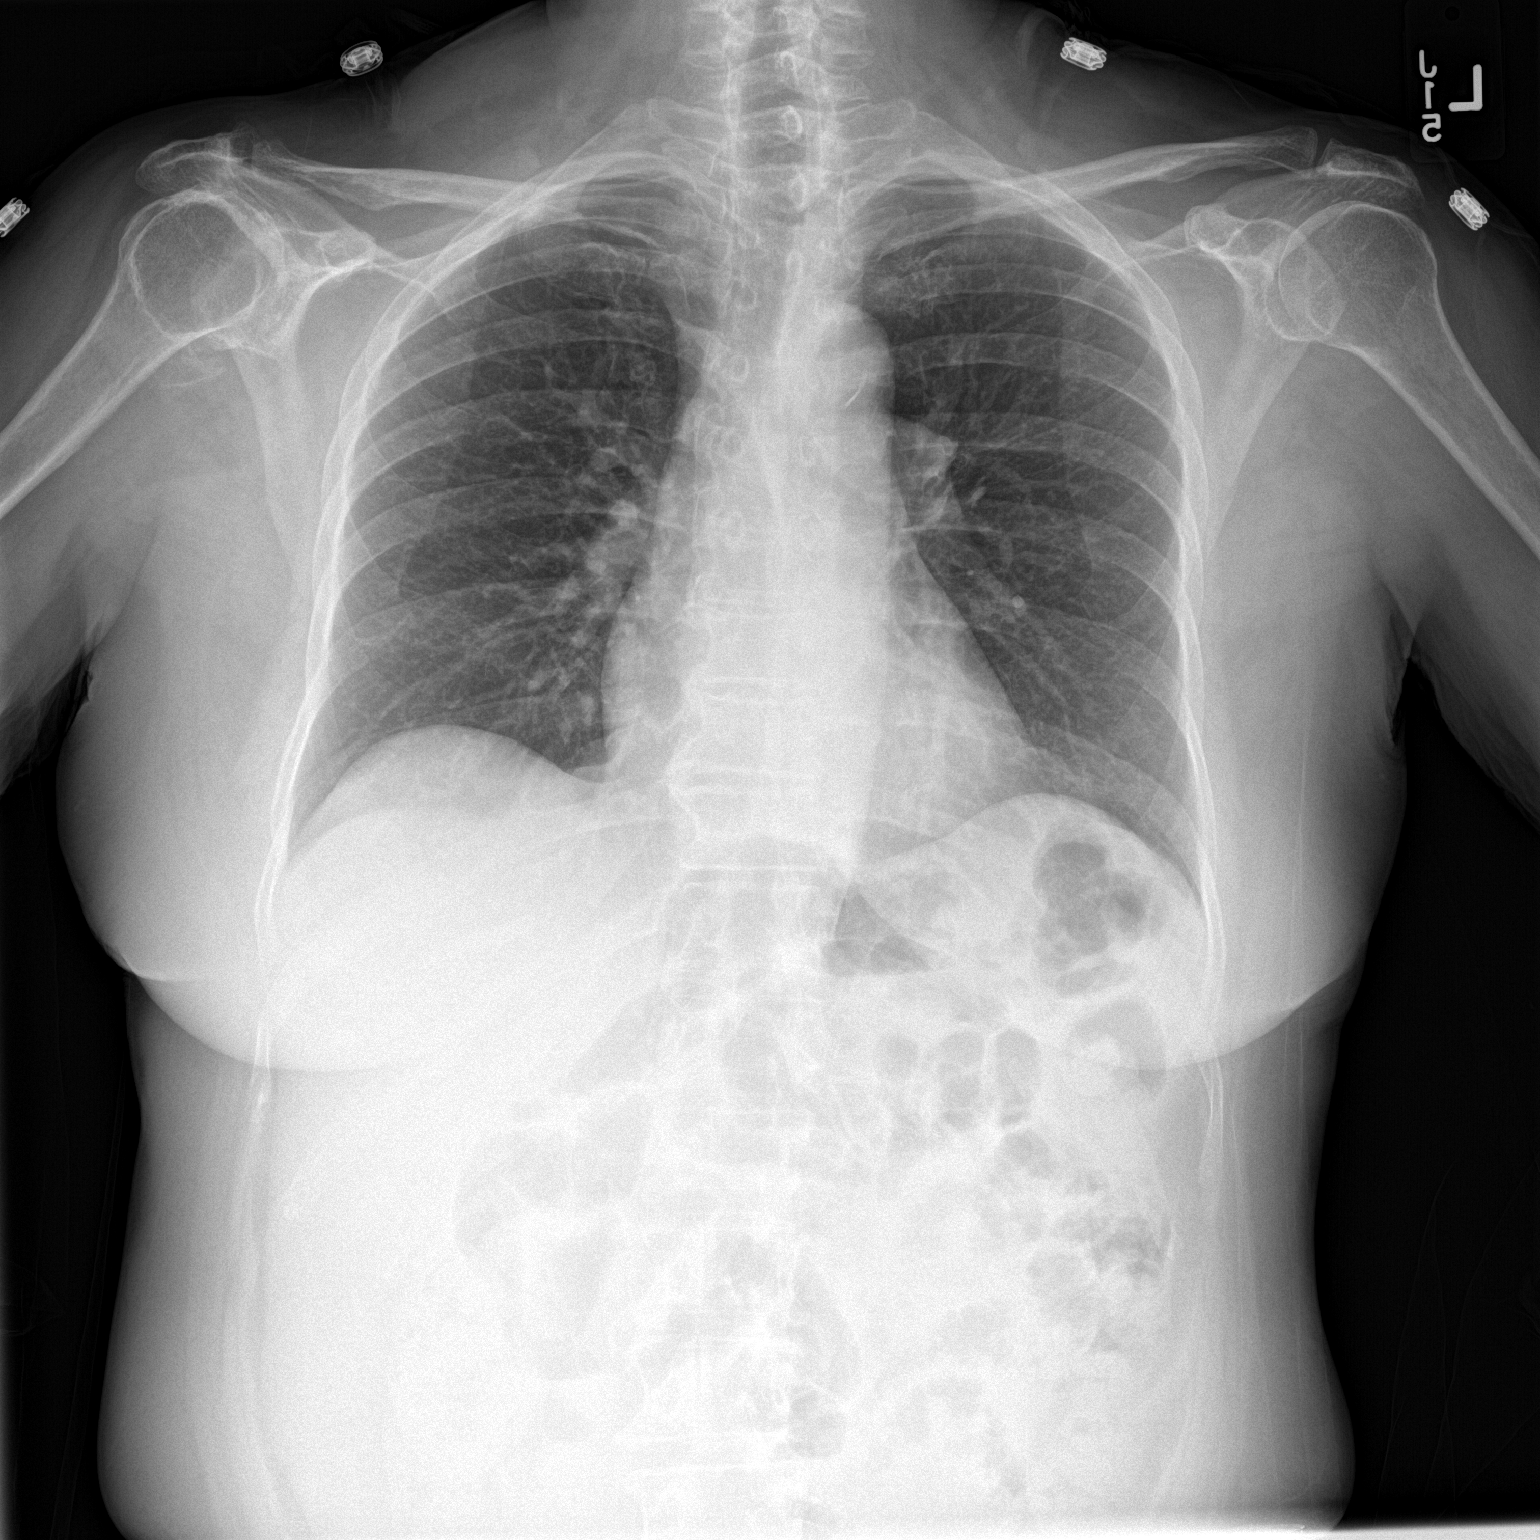

[chest lat]
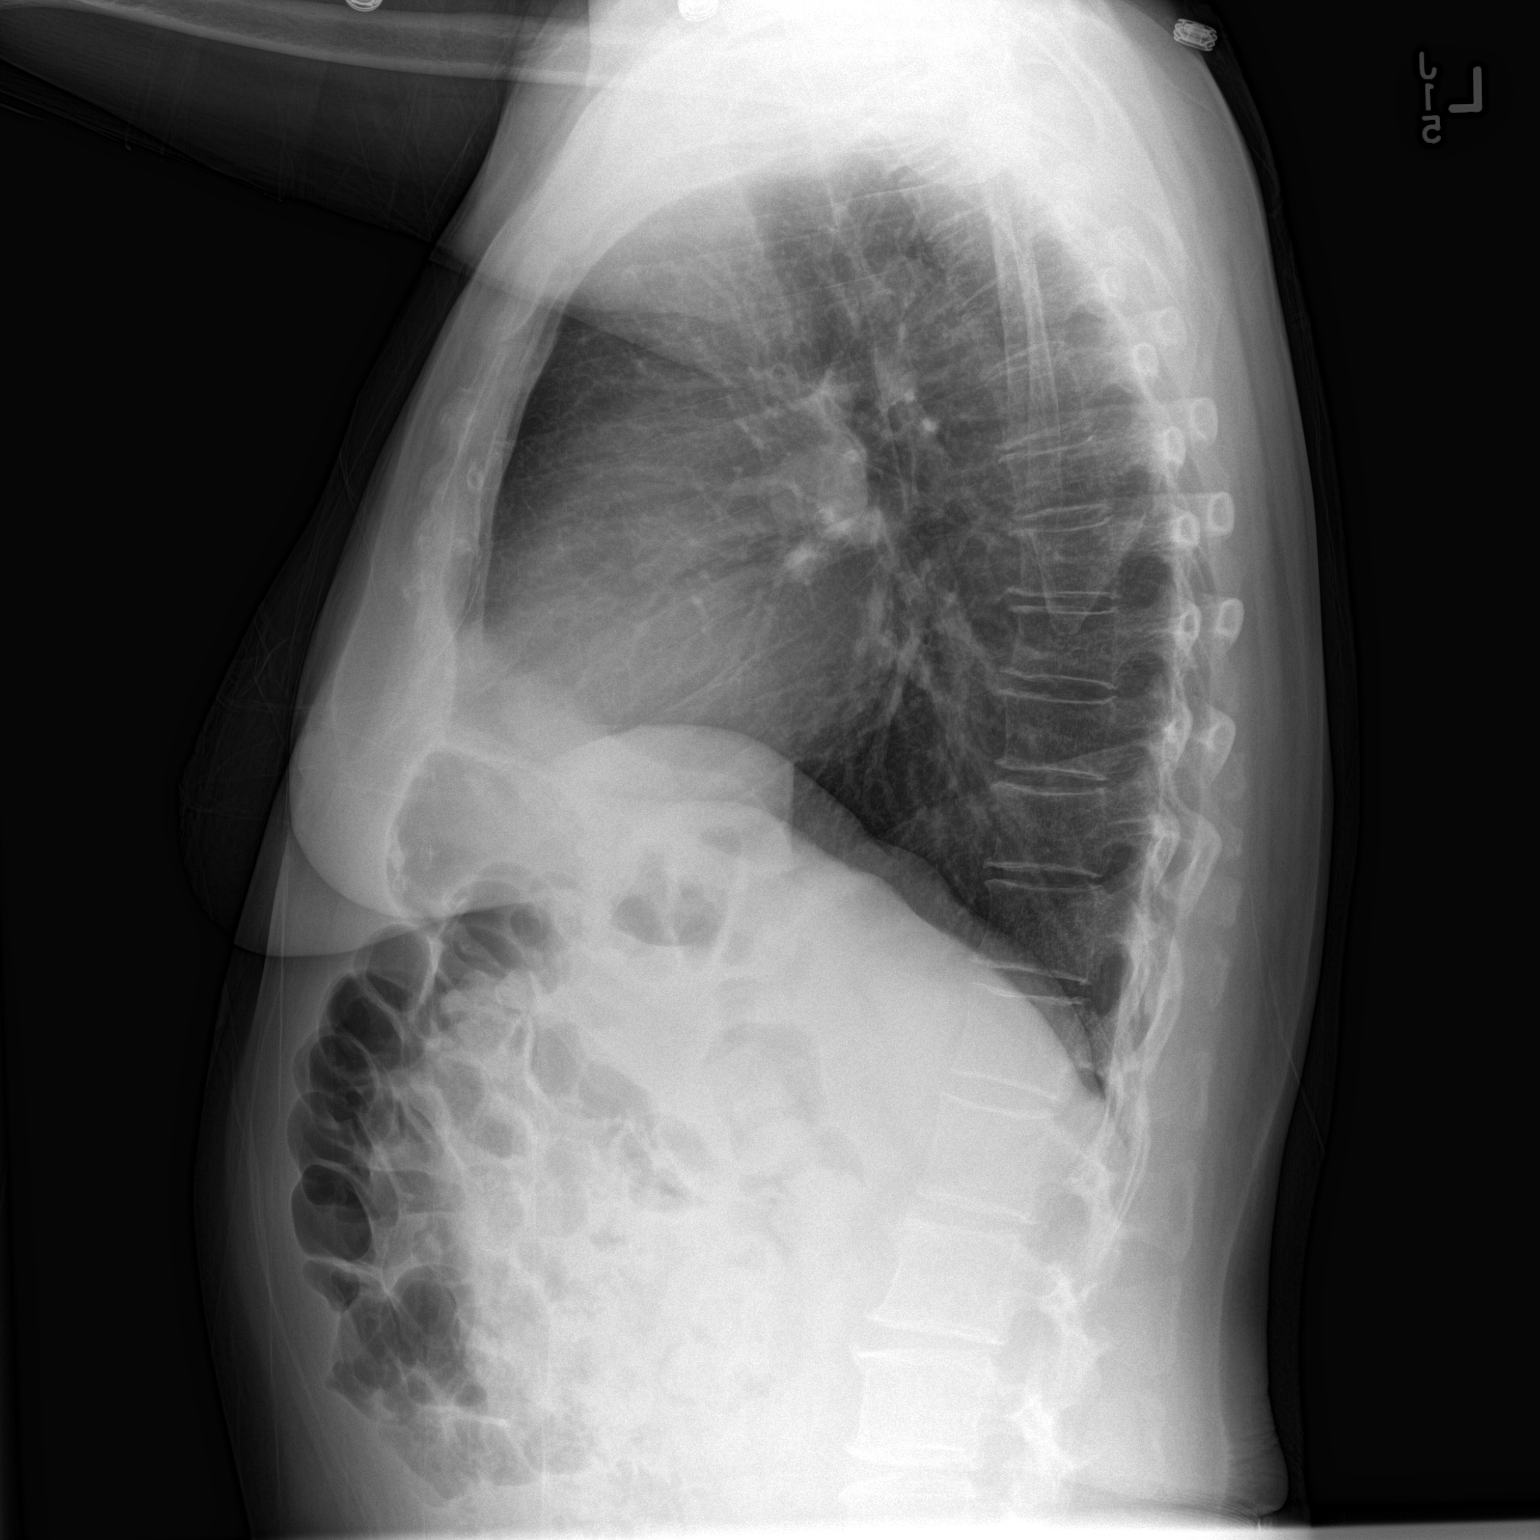

[2 of 2 positions shown; findings below may reference images not displayed]

FINDINGS: Cardiomediastinal silhouette is stable. No acute infiltrate or
pleural effusion. No pulmonary edema. Minimal degenerative changes
thoracic spine.
IMPRESSION: No active cardiopulmonary disease.

## 2016-01-15 IMAGING — CR DG ANKLE 2V *R*
2 series · 2 of 2 positions shown · non-contrast
Comparison: March 26, 2014

CLINICAL DATA: Pain and swelling for 1 week

EXAM:
RIGHT ANKLE - 2 VIEW

[ankle ap]
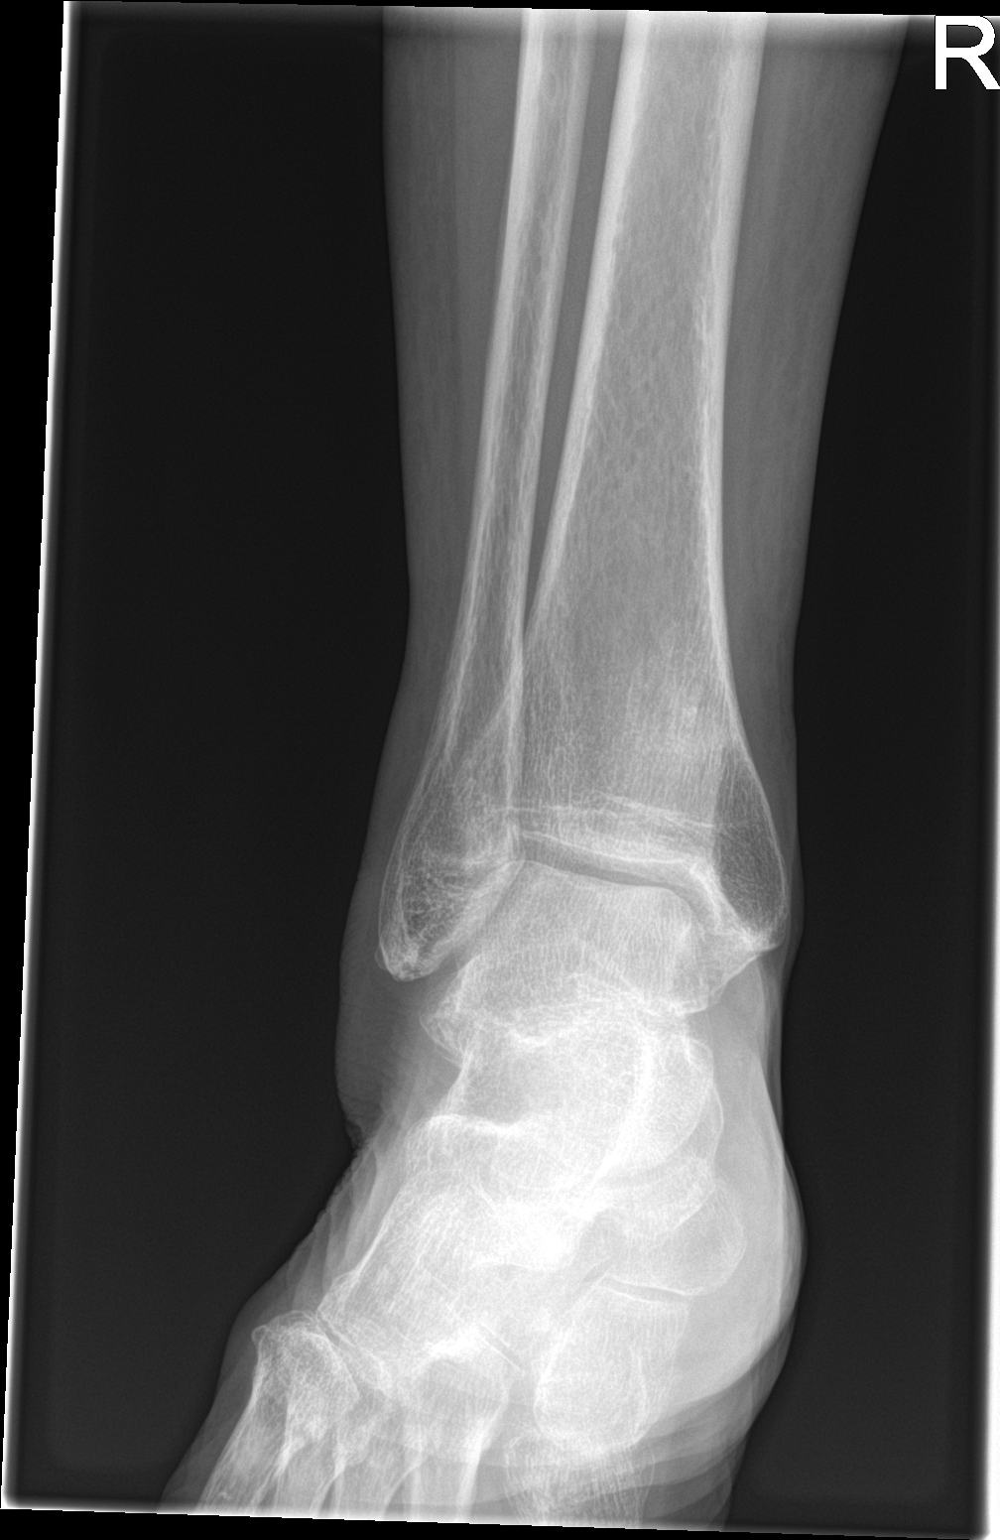

[ankle lat]
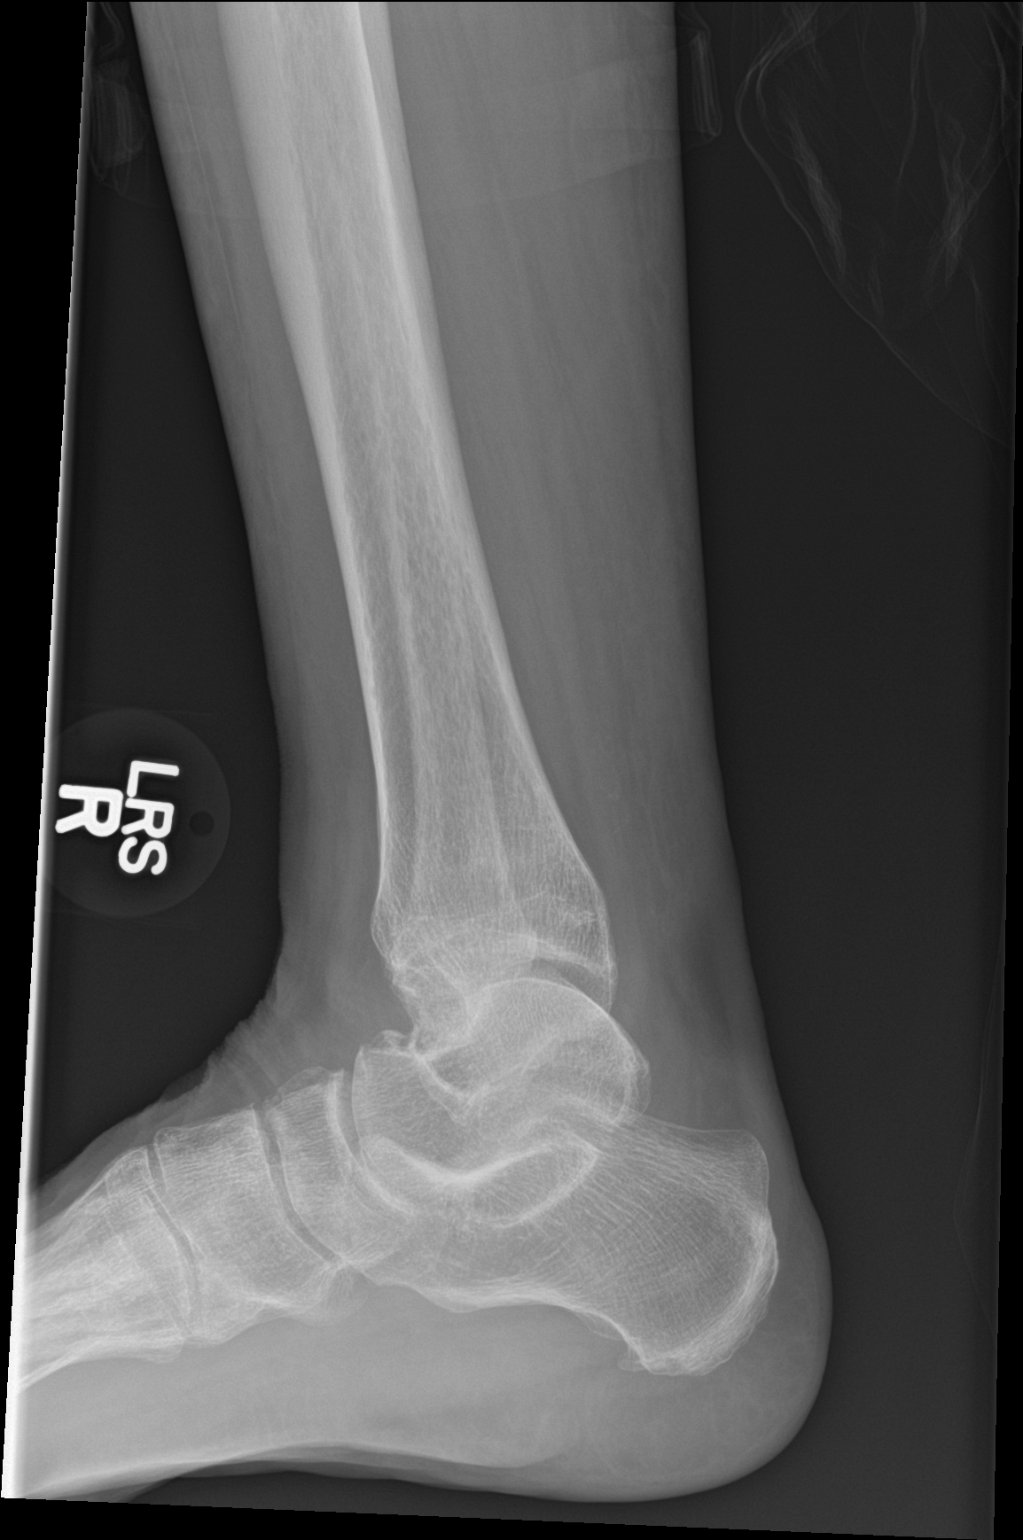

[2 of 2 positions shown; findings below may reference images not displayed]

FINDINGS: Frontal and lateral views were obtained. There is mild generalized
soft tissue swelling. There is no appreciable fracture or joint
effusion. Ankle mortise appears intact. There is a small spur
arising from the inferior calcaneus.
IMPRESSION: Mild generalized soft tissue swelling. No fracture. Mortise intact.
Small inferior calcaneal spur.

## 2016-01-15 IMAGING — CR DG ANKLE 2V *L*
2 series · 2 of 2 positions shown · non-contrast
Comparison: Left foot MRI, 03/15/2010.

CLINICAL DATA: Bilateral ankle pain swelling, worse on the left. No
specific injury.

EXAM:
LEFT ANKLE - 2 VIEW

[ankle ap]
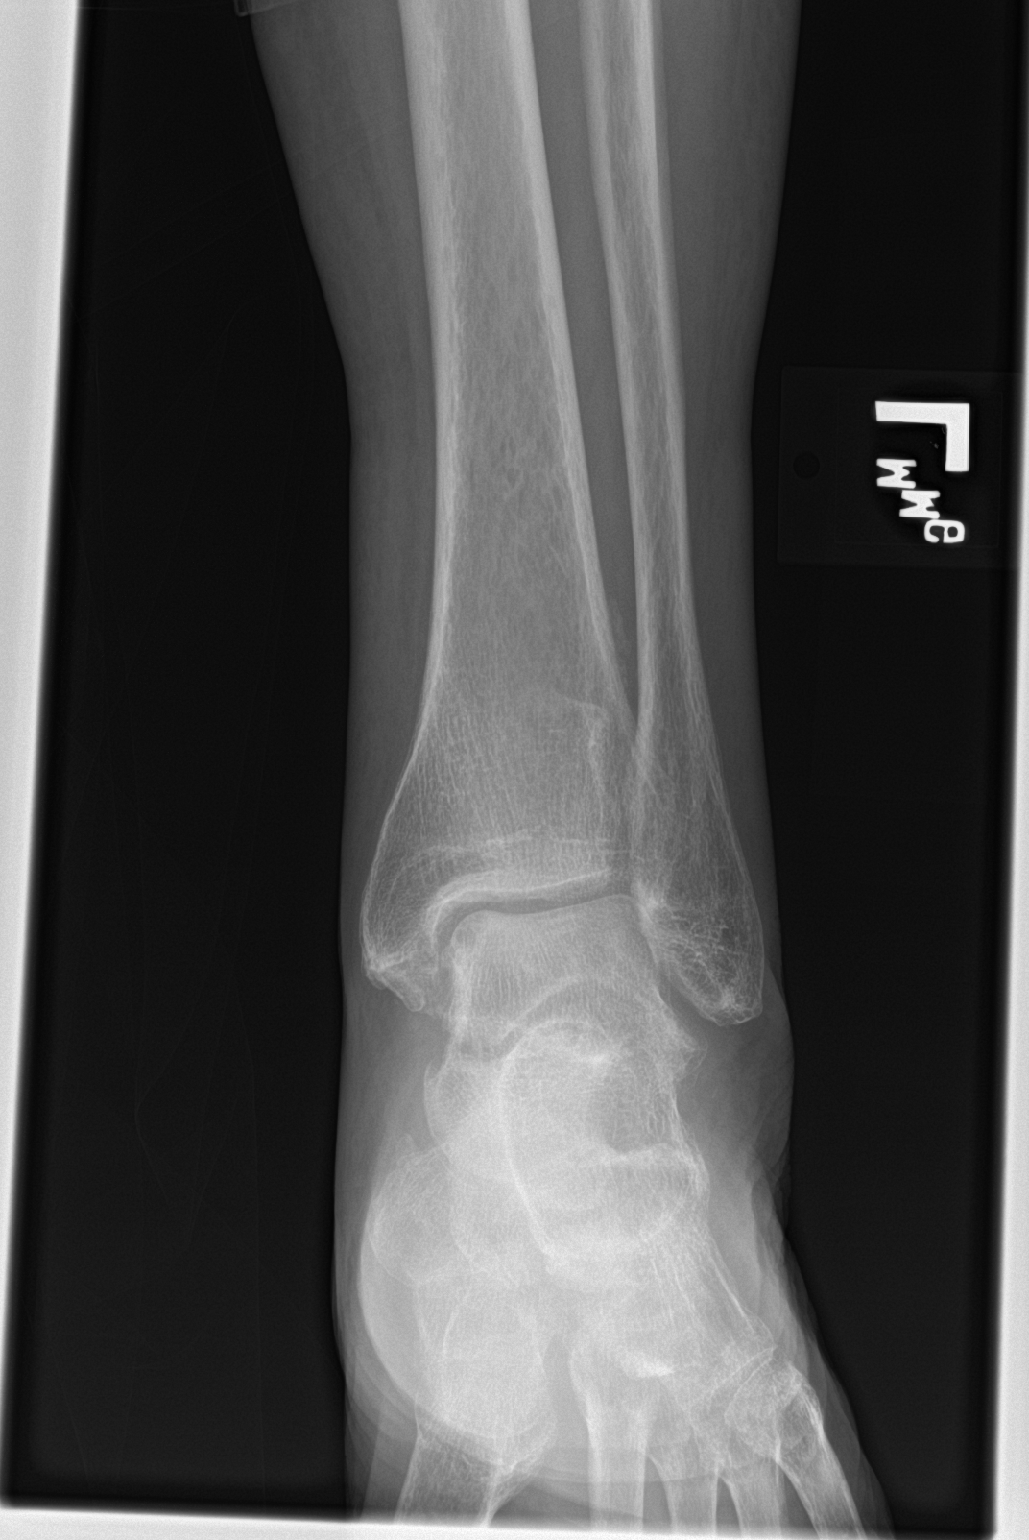

[ankle lat]
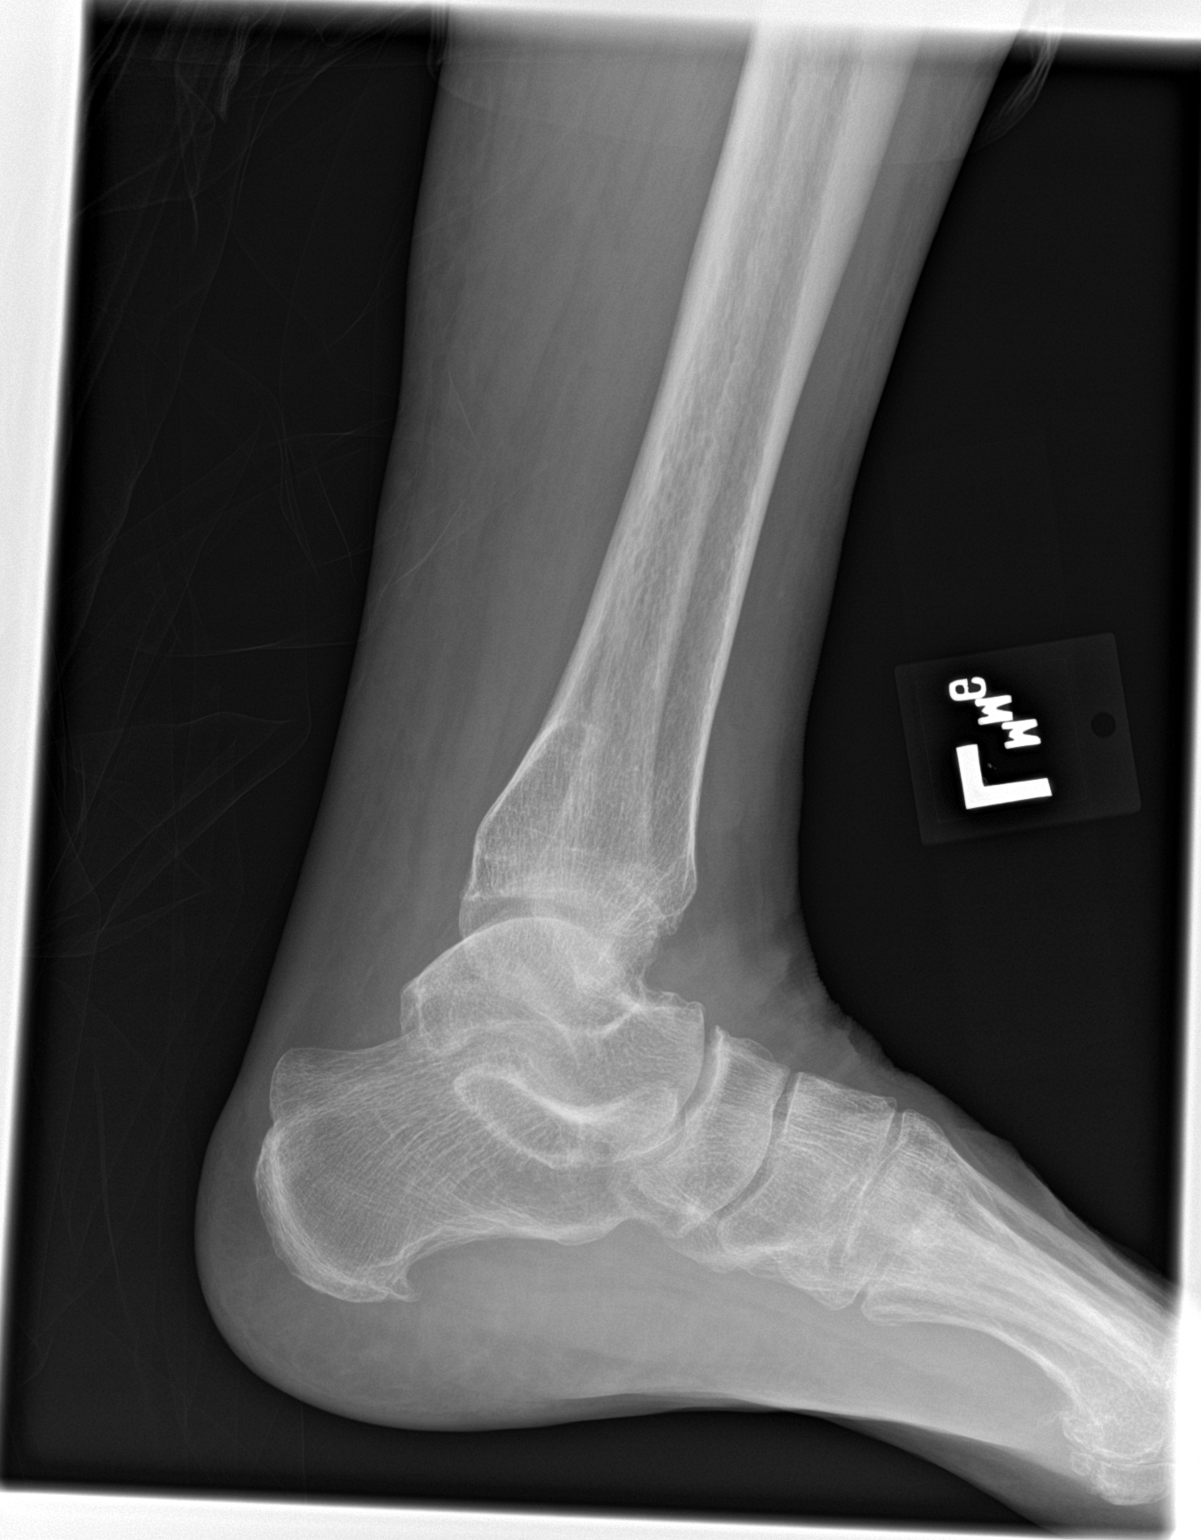

[2 of 2 positions shown; findings below may reference images not displayed]

FINDINGS: No fracture. Ankle mortise is normally spaced and aligned. Bones are
diffusely demineralized. There is a small plantar calcaneal spur.
Soft tissues are unremarkable.
IMPRESSION: No fracture. No significant ankle joint abnormality. Small calcaneal
spur.

## 2016-04-29 ENCOUNTER — Telehealth: Payer: Self-pay | Admitting: Internal Medicine

## 2016-04-29 ENCOUNTER — Other Ambulatory Visit: Payer: Self-pay | Admitting: Internal Medicine

## 2016-04-29 NOTE — Telephone Encounter (Signed)
Pt request refill of the following:  Fluticasone Furoate-Vilanterol (BREO ELLIPTA) 100-25 MCG/INH AEPB   Phamacy:  [facility]

## 2016-04-30 NOTE — Telephone Encounter (Signed)
Ok to refill x 1  

## 2016-04-30 NOTE — Telephone Encounter (Signed)
Sent to the pharmacy by e-scribe. 

## 2016-06-03 ENCOUNTER — Encounter: Payer: Self-pay | Admitting: Internal Medicine

## 2016-08-22 ENCOUNTER — Ambulatory Visit: Payer: Self-pay | Admitting: Family Medicine

## 2016-08-27 ENCOUNTER — Telehealth: Payer: Self-pay

## 2016-08-27 NOTE — Telephone Encounter (Signed)
Spoke with patient in regards to her Prolia injection. I am completing paperwork for her now through the [employer]. This will hopefully provide her with financial assistance for the cost of her injection. She will come by the office tomorrow morning to complete the paperwork.

## 2016-09-17 ENCOUNTER — Telehealth: Payer: Self-pay

## 2016-09-17 NOTE — Telephone Encounter (Signed)
Spoke with patient to let her know that through [employer] her Prolia injection will be covered.She has to give them permission to ship the injection to ur site. Patient verbalized understanding and is aware to look for their phone call.

## 2016-10-03 ENCOUNTER — Ambulatory Visit (INDEPENDENT_AMBULATORY_CARE_PROVIDER_SITE_OTHER): Payer: Self-pay | Admitting: Family Medicine

## 2016-10-03 DIAGNOSIS — M810 Age-related osteoporosis without current pathological fracture: Secondary | ICD-10-CM

## 2016-10-03 MED ORDER — DENOSUMAB 60 MG/ML ~~LOC~~ SOLN
60.0000 mg | Freq: Once | SUBCUTANEOUS | Status: AC
  Administered 2016-10-03: 60 mg via SUBCUTANEOUS

## 2017-04-07 ENCOUNTER — Telehealth: Payer: Self-pay | Admitting: Internal Medicine

## 2017-04-07 NOTE — Telephone Encounter (Signed)
Phone call from the answering service, EMT reports that the patient has died, chart is reviewed, not seen by Dr Fabian Sharp in more than a year . I told the answering service I doubt PCP will sign the death certificate but I will let her know.

## 2017-04-08 NOTE — Telephone Encounter (Signed)
I have not seen this patient in 2 years   She cancelled many appts.  ( 5 in a row )  So  I was Not actively treating her  And cannot  comment on her medical status.  Although she apparently  was getting  prolia   By  protocol   Lab Results  Component Value Date   WBC 6.2 10/03/2015   HGB 16.1 (H) 10/03/2015   HCT 47.5 (H) 10/03/2015   PLT 197.0 10/03/2015   GLUCOSE 107 (H) 10/03/2015   CHOL 244 (H) 02/08/2015   TRIG 147.0 02/08/2015   HDL 79.50 02/08/2015   LDLDIRECT 120.5 07/30/2011   LDLCALC 135 (H) 02/08/2015   ALT 44 (H) 10/26/2015   AST 70 (H) 10/26/2015   NA 140 10/03/2015   K 3.9 10/03/2015   CL 99 10/03/2015   CREATININE 0.49 10/03/2015   BUN 6 10/03/2015   CO2 25 10/03/2015   TSH 3.27 01/01/2015   INR 0.88 09/20/2010   HGBA1C 5.6 04/17/2014

## 2017-04-14 DEATH — deceased
